# Patient Record
Sex: Female | Born: 2000 | Race: White | Hispanic: Yes | Marital: Single | Smoking: Former smoker
Health system: Southern US, Community
[De-identification: ages and names within clinical notes are randomized; demographics above are authoritative.]

## PROBLEM LIST (undated history)

## (undated) ENCOUNTER — Inpatient Hospital Stay (HOSPITAL_COMMUNITY): Payer: Self-pay

## (undated) DIAGNOSIS — D649 Anemia, unspecified: Secondary | ICD-10-CM

## (undated) DIAGNOSIS — Z789 Other specified health status: Secondary | ICD-10-CM

## (undated) DIAGNOSIS — H6690 Otitis media, unspecified, unspecified ear: Secondary | ICD-10-CM

## (undated) HISTORY — DX: Otitis media, unspecified, unspecified ear: H66.90

## (undated) HISTORY — PX: ADENOIDECTOMY: SUR15

## (undated) HISTORY — PX: APPENDECTOMY: SHX54

## (undated) HISTORY — PX: TYMPANOSTOMY TUBE PLACEMENT: SHX32

---
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
## 2019-05-22 ENCOUNTER — Other Ambulatory Visit: Payer: Self-pay

## 2019-05-22 ENCOUNTER — Telehealth: Payer: Self-pay | Admitting: Pediatrics

## 2019-05-22 ENCOUNTER — Encounter: Payer: Self-pay | Admitting: Pediatrics

## 2019-05-22 ENCOUNTER — Ambulatory Visit (INDEPENDENT_AMBULATORY_CARE_PROVIDER_SITE_OTHER): Payer: Medicaid Other | Admitting: Pediatrics

## 2019-05-22 DIAGNOSIS — Z20828 Contact with and (suspected) exposure to other viral communicable diseases: Secondary | ICD-10-CM | POA: Diagnosis not present

## 2019-05-22 DIAGNOSIS — Z20822 Contact with and (suspected) exposure to covid-19: Secondary | ICD-10-CM

## 2019-05-22 NOTE — Progress Notes (Signed)
Virtual Visit via Video Note  I connected with Donna Fernandez 's patient  on 05/22/19 at  3:45 PM EDT by a video enabled telemedicine application and verified that I am speaking with the correct person using two identifiers.   Location of patient/parent: Home   I discussed the limitations of evaluation and management by telemedicine and the availability of in person appointments.  I discussed that the purpose of this telehealth visit is to provide medical care while limiting exposure to the novel coronavirus.  The patient expressed understanding and agreed to proceed.  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  She is unsure if she was in direct contact but has been in the vicinity of the cases and been exposed to other coworkers.  She has now started with nausea and cough symptoms since yesterday.  She also is having some nasal congestion.  No history of any vomiting or diarrhea.  She had some abdominal discomfort and some clear vaginal discharge yesterday.  She took a pregnancy test at home that was negative.  She has an IUD in place for contraception.  No history of any fevers.  She reported that her siblings also had some cough and nasal congestion but no fevers.  Her coworkers got tested at work and she would like to get tested too.   Observations/Objective:  Well-appearing with no respiratory distress.  No coughing during the visit.  Assessment and Plan:  18 year old female with mild upper respiratory illness. Possible exposure to COVID at work.  Placed an order for COVID testing at the [place] center.  Advised patient to isolate at home for 10 days since the start of symptoms and at least 24 hours fever free before returning to work.  She will be notified of the test results within 4 to 7 days after  testing. Continue to wear mask, wash hands and social distance.  Follow Up Instructions:    I discussed the assessment and treatment plan with the patient and/or parent/guardian. They were provided an opportunity to ask questions and all were answered. They agreed with the plan and demonstrated an understanding of the instructions.   They were advised to call back or seek an in-person evaluation in the emergency room if the symptoms worsen or if the condition fails to improve as anticipated.  I spent 15 minutes on this telehealth visit inclusive of face-to-face video and care coordination time I was located at [place] [facility] during this encounter.  [name], MD

## 2019-05-22 NOTE — Telephone Encounter (Signed)
Spoke with Dr Derrell Lolling to see what needs to be included in letter regarding quarantine time.

## 2019-05-22 NOTE — Telephone Encounter (Signed)
Patient call providing the work fax number [phone] for the excuse for work.

## 2019-05-22 NOTE — Telephone Encounter (Signed)
Letter drafted and faxed to her work.

## 2019-05-23 ENCOUNTER — Other Ambulatory Visit: Payer: Self-pay

## 2019-05-23 DIAGNOSIS — Z20822 Contact with and (suspected) exposure to covid-19: Secondary | ICD-10-CM

## 2019-05-26 LAB — NOVEL CORONAVIRUS, NAA: SARS-CoV-2, NAA: NOT DETECTED

## 2019-07-10 ENCOUNTER — Ambulatory Visit: Payer: Medicaid Other | Admitting: Family

## 2019-12-30 ENCOUNTER — Other Ambulatory Visit: Payer: Self-pay

## 2019-12-30 ENCOUNTER — Emergency Department (HOSPITAL_COMMUNITY)
Admission: EM | Admit: 2019-12-30 | Discharge: 2019-12-30 | Disposition: A | Discharge status: Home / Self Care | Payer: Medicaid Other | Attending: Emergency Medicine | Admitting: Emergency Medicine

## 2019-12-30 ENCOUNTER — Encounter (HOSPITAL_COMMUNITY): Payer: Self-pay | Admitting: Emergency Medicine

## 2019-12-30 DIAGNOSIS — M79605 Pain in left leg: Secondary | ICD-10-CM | POA: Diagnosis present

## 2019-12-30 DIAGNOSIS — L03116 Cellulitis of left lower limb: Secondary | ICD-10-CM | POA: Insufficient documentation

## 2019-12-30 LAB — CBC WITH DIFFERENTIAL/PLATELET
Abs Immature Granulocytes: 0.04 10*3/uL (ref 0.00–0.07)
Basophils Absolute: 0 10*3/uL (ref 0.0–0.1)
Basophils Relative: 0 %
Eosinophils Absolute: 0.2 10*3/uL (ref 0.0–0.5)
Eosinophils Relative: 3 %
HCT: 43.3 % (ref 36.0–46.0)
Hemoglobin: 14 g/dL (ref 12.0–15.0)
Immature Granulocytes: 0 %
Lymphocytes Relative: 38 %
Lymphs Abs: 3.4 10*3/uL (ref 0.7–4.0)
MCH: 30.4 pg (ref 26.0–34.0)
MCHC: 32.3 g/dL (ref 30.0–36.0)
MCV: 93.9 fL (ref 80.0–100.0)
Monocytes Absolute: 0.7 10*3/uL (ref 0.1–1.0)
Monocytes Relative: 8 %
Neutro Abs: 4.6 10*3/uL (ref 1.7–7.7)
Neutrophils Relative %: 51 %
Platelets: 337 10*3/uL (ref 150–400)
RBC: 4.61 MIL/uL (ref 3.87–5.11)
RDW: 12.5 % (ref 11.5–15.5)
WBC: 9.1 10*3/uL (ref 4.0–10.5)
nRBC: 0 % (ref 0.0–0.2)

## 2019-12-30 LAB — BASIC METABOLIC PANEL
Anion gap: 9 (ref 5–15)
BUN: 12 mg/dL (ref 6–20)
CO2: 25 mmol/L (ref 22–32)
Calcium: 9.1 mg/dL (ref 8.9–10.3)
Chloride: 105 mmol/L (ref 98–111)
Creatinine, Ser: 0.55 mg/dL (ref 0.44–1.00)
GFR calc Af Amer: >60 mL/min (ref >60)
GFR calc non Af Amer: >60 mL/min (ref >60)
Glucose, Bld: 99 mg/dL (ref 70–99)
Potassium: 3.5 mmol/L (ref 3.5–5.1)
Sodium: 139 mmol/L (ref 135–145)

## 2019-12-30 LAB — I-STAT BETA HCG BLOOD, ED (MC, WL, AP ONLY): I-stat hCG, quantitative: <5 m[IU]/mL (ref <5)

## 2019-12-30 MED ORDER — DOXYCYCLINE HYCLATE 100 MG PO TABS
100.0000 mg | ORAL_TABLET | Freq: Once | ORAL | Status: AC
  Administered 2019-12-30: 100 mg via ORAL
  Filled 2019-12-30: qty 1

## 2019-12-30 MED ORDER — DOXYCYCLINE HYCLATE 100 MG PO CAPS: 100.0000 mg | ORAL_CAPSULE | Freq: Two times a day (BID) | ORAL | 0 refills | Status: DC

## 2019-12-30 NOTE — Discharge Instructions (Addendum)
Do not breast feed while taking the antibiotic.    If you wish to continue breast feeding, you should pump and waste.    Otherwise, do not breast feed until 2 days after stopping the antibiotic.  Return to the ER if symptoms worsen.

## 2019-12-30 NOTE — ED Triage Notes (Addendum)
Patient reports worsening cellulitis at left foot onset yesterday with redness,swelling and pain , denies injury , no fever/ambulatory .

## 2019-12-30 NOTE — ED Provider Notes (Signed)
[facility] EMERGENCY DEPARTMENT Provider Note   CSN: [id number] Arrival date & time: 12/30/19  7829     History Chief Complaint  Patient presents with  . Cellulitis    L foot    Donna Fernandez is a 19 y.o. female.  Patient presents to the emergency department with a chief complaint of left leg pain.  She states that she has noticed some redness on her left foot.  She states that she may have gotten a sore from wearing boots earlier in the week.  She denies any fevers or chills.  Denies history of diabetes.  She denies any treatments prior to arrival.  Has had some red streaking up her foot.  The history is provided by the patient. No language interpreter was used.       Past Medical History:  Diagnosis Date  . Anemia   . Medical history non-contributory   . Otitis media, recurrent     Patient Active Problem List   Diagnosis Date Noted  . 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 Normal labor 04/05/2018  . Preterm contractions 03/07/2018  . Myopia 09/02/2014  . BMI (body mass index), pediatric, 85% to less than 95% for age 38/30/2015    Past Surgical History:  Procedure Laterality Date  . ADENOIDECTOMY    . APPENDECTOMY    . LAPAROSCOPIC APPENDECTOMY N/A 07/29/2013   Procedure: APPENDECTOMY LAPAROSCOPIC;  Surgeon: Judie Petit. Leonia Corona, MD;  Location: [facility] OR;  Service: Pediatrics;  Laterality: N/A;  . TYMPANOSTOMY TUBE PLACEMENT       OB History    Gravida  1   Para  1   Term  1   Preterm      AB      Living  1     SAB      TAB      Ectopic      Multiple  0   Live Births  1           Family History  Problem Relation Age of Onset  . Asthma Mother   . Diabetes type II Maternal Uncle   . Diabetes type II Maternal Aunt     Social History   Tobacco Use  . Smoking status: Never Smoker  . Smokeless tobacco: Never Used  Substance Use Topics  . Alcohol use: No    Alcohol/week: 0.0 standard drinks  . Drug use: No     Home Medications Prior to Admission medications   Medication Sig Start Date End Date Taking? Authorizing Provider  ibuprofen (ADVIL,MOTRIN) 600 MG tablet Take 1 tablet (600 mg total) by mouth every 6 (six) hours. Patient not taking: Reported on 10/25/2018 04/07/18   Katrinka Blazing, [place], CNM  naproxen (NAPROSYN) 250 MG tablet Take 1 tablet (250 mg total) by mouth 2 (two) times daily with a meal. 11/14/18   Simha, Bartolo Darter, MD  Prenatal Vit-Fe Fumarate-FA (PRENATAL MULTIVITAMIN) TABS tablet Take 1 tablet by mouth daily at 12 noon.    [provider]    Allergies    Patient has no known allergies.  Review of Systems   Review of Systems  All other systems reviewed and are negative.   Physical Exam Updated Vital Signs BP 125/87   Pulse 88   Temp 98.4 F (36.9 C)   Resp 17   Ht 5\' 1"  (1.549 m)   Wt 85 kg   SpO2 100%   BMI 35.41 kg/m   Physical Exam Vitals  and nursing note reviewed.  Constitutional:      General: She is not in acute distress.    Appearance: She is well-developed.  HENT:     Head: Normocephalic and atraumatic.  Eyes:     Conjunctiva/sclera: Conjunctivae normal.  Cardiovascular:     Rate and Rhythm: Normal rate.     Heart sounds: No murmur.  Pulmonary:     Effort: Pulmonary effort is normal. No respiratory distress.  Abdominal:     General: There is no distension.  Musculoskeletal:     Cervical back: Neck supple.       Feet:     Comments: Moves all extremities  Skin:    General: Skin is warm and dry.  Neurological:     Mental Status: She is alert and oriented to person, place, and time.  Psychiatric:        Mood and Affect: Mood normal.        Behavior: Behavior normal.     ED Results / Procedures / Treatments   Labs (all labs ordered are listed, but only abnormal results are displayed) Labs Reviewed  CBC WITH DIFFERENTIAL/PLATELET  BASIC METABOLIC PANEL  I-STAT BETA HCG BLOOD, ED ([facility], [facility], [facility] ONLY)    EKG None  Radiology No results  found.  Procedures Procedures (including critical care time)  Medications Ordered in ED Medications  doxycycline (VIBRA-TABS) tablet 100 mg (100 mg Oral Given 12/30/19 6761)    ED Course  I have reviewed the triage vital signs and the nursing notes.  Pertinent labs & imaging results that were available during my care of the patient were reviewed by me and considered in my medical decision making (see chart for details).    MDM Rules/Calculators/A&P                      Patient with cellulitis of her left lower extremity.  There is some streaking.  She has no evidence of abscess.  She is not diabetic.  She is not pregnant.  She is breast-feeding, but I have encouraged her to pump and waste if she wishes to continue breast-feeding.  Her 20 mo infant is eating table food and is not dependant on the breast milk.  Return precautions given.   Final Clinical Impression(s) / ED Diagnoses Final diagnoses:  Cellulitis of left lower extremity    Rx / DC Orders ED Discharge Orders         Ordered    doxycycline (VIBRAMYCIN) 100 MG capsule  2 times daily     12/30/19 0615           Montine Circle, PA-C 12/30/19 0617    Palumbo, April, MD 12/30/19 9509

## 2020-01-06 ENCOUNTER — Telehealth: Payer: Self-pay | Admitting: Pediatrics

## 2020-01-06 NOTE — Telephone Encounter (Signed)

## 2020-01-07 ENCOUNTER — Encounter: Payer: Self-pay | Admitting: Pediatrics

## 2020-01-07 ENCOUNTER — Other Ambulatory Visit: Payer: Self-pay

## 2020-01-07 ENCOUNTER — Ambulatory Visit (INDEPENDENT_AMBULATORY_CARE_PROVIDER_SITE_OTHER): Payer: Medicaid Other | Admitting: Pediatrics

## 2020-01-07 VITALS — BP 118/76 | HR 88 | Ht 61.58 in | Wt 188.0 lb

## 2020-01-07 DIAGNOSIS — Z113 Encounter for screening for infections with a predominantly sexual mode of transmission: Secondary | ICD-10-CM

## 2020-01-07 DIAGNOSIS — Z0000 Encounter for general adult medical examination without abnormal findings: Secondary | ICD-10-CM | POA: Diagnosis not present

## 2020-01-07 DIAGNOSIS — Z68.41 Body mass index (BMI) pediatric, greater than or equal to 95th percentile for age: Secondary | ICD-10-CM

## 2020-01-07 DIAGNOSIS — E669 Obesity, unspecified: Secondary | ICD-10-CM | POA: Diagnosis not present

## 2020-01-07 DIAGNOSIS — Z0001 Encounter for general adult medical examination with abnormal findings: Secondary | ICD-10-CM

## 2020-01-07 LAB — POCT RAPID HIV: Rapid HIV, POC: NEGATIVE

## 2020-01-07 NOTE — Patient Instructions (Signed)
Adult Primary Care Clinics Name Criteria Services   [facility]  Address: [address] [place], [place] [address]  Phone: [phone] Hours: Monday - Friday 9 AM -6 PM  Types of insurance accepted:  Commercial insurance [facility] (orange card) Medicaid Medicare Uninsured  Language services:  Video and phone interpreters available   Ages 18 and older    Adult primary care Onsite pharmacy Integrated behavioral health Financial assistance counseling Walk-in hours for established patients  Financial assistance counseling hours: Tuesdays 2:00PM - 5:00PM  Thursday 8:30AM - 4:30PM  Space is limited, 10 on Tuesday and 20 on Thursday. It's on first come first serve basis  Name Criteria Services   [facility]  Address: [address] [place], [place] [address]  Phone: [phone]  Hours: Monday - Friday 8:30 AM - 5 PM  Types of insurance accepted:  Commercial insurance Medicaid Medicare Uninsured  Language services:  Video and phone interpreters available   All ages - newborn to adult   Primary care for all ages (children and adults) Integrated behavioral health Nutritionist Financial assistance counseling   Name Criteria Services   [facility]  Located on the ground floor of [facility]  Address: [address]  [place],  [place]  [address]  Phone: [phone]  Hours: Monday - Friday 8:15 AM - 5 PM  Types of insurance accepted:  Commercial insurance Medicaid Medicare Uninsured  Language services:  Video and phone interpreters available   Ages 18 and older   Adult primary care Nutritionist Certified Diabetes Educator  Integrated behavioral health Financial assistance counseling   Name Criteria Services   [facility]  Address: [address] [place], [place] [address]  Phone:  [phone]  Hours: Monday - Friday 8:30 AM - 5 PM    Types of insurance accepted:  Commercial insurance Medicaid Medicare Uninsured  Language services:  Video and phone interpreters available   All ages - newborn to adult   Primary care for all ages (children and adults) Integrated behavioral health Financial assistance counseling    

## 2020-01-07 NOTE — Progress Notes (Signed)
Adolescent Well Care Visit Donna Fernandez is a 19 y.o. female who is here for well care.    PCP:  [name], MD   History was provided by the patient.   Current Issues: Current concerns include: No specific concerns today.  Overall patient is doing well.  She has a history of migraines but reports that both have been better recently and she only occasionally uses medication such as Motrin for headaches.  Patient has significant sleep deprivation due to working third shift and going to school during the daytime.  She is a Consulting civil engineer at [employer]  She also has to care for her 51-month-old baby.  Nutrition: Nutrition/Eating Behaviors: Eats a variety of foods  Adequate calcium in diet?:  Cheese, some milk Supplements/ Vitamins: No  Exercise/ Media: Play any Sports?/ Exercise: Goes to the gym to exercise   Sleep:  Sleep: Gets only 4 to 5 hours of sleep per day due to night shift and has school during the daytime  Social Screening: Lives with: Mom and siblings.  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  no Stressors of note: yes- school & work  Education: School Name: Engineer, manufacturing nursing degree  Menstruation:   No LMP recorded. (Menstrual status: IUD). Menstrual History: IUD placed after birth of baby 04/18/2018  Confidential Social History: Tobacco?  no Secondhand smoke exposure?  no Drugs/ETOH?  no  Sexually Active?  yes but not currently Pregnancy Prevention: IUD  Safe at home, in school & in relationships?  Yes Safe to self?  Yes   Screenings: Patient has a dental home: yes  The patient completed the Rapid Assessment for Adolescent Preventive Services screening questionnaire and the following topics were identified as risk factors and discussed: healthy eating, exercise, tobacco use, drug use, condom use and  mental health issues   PHQ-9 completed and results indicated-negative screen  Physical Exam:  Vitals:   01/07/20 1344  BP: 118/76  Pulse: 88  Weight: 188 lb (85.3 kg)  Height: 5' 1.58" (1.564 m)   BP 118/76 (BP Location: Right Arm, Patient Position: Sitting, Cuff Size: Large)   Pulse 88   Ht 5' 1.58" (1.564 m)   Wt 188 lb (85.3 kg)   BMI 34.86 kg/m  Body mass index: body mass index is 34.86 kg/m. Blood pressure percentiles are not available for patients who are 18 years or older.   Hearing Screening   Method: Audiometry   125Hz  250Hz  500Hz  1000Hz  2000Hz  3000Hz  4000Hz  6000Hz  8000Hz   Right ear:   20 20 20  20     Left ear:   20 20 20  20       Visual Acuity Screening   Right eye Left eye Both eyes  Without correction: 20/20 20/20 20/20   With correction:       General Appearance:   alert, oriented, no acute distress  HENT: Normocephalic, PE tube left ear no obvious abnormality, conjunctiva clear  Mouth:   Normal appearing teeth, no obvious discoloration, dental caries, or dental caps  Neck:   Supple; thyroid: no enlargement, symmetric, no tenderness/mass/nodules  Chest  normal breast exam, no masses palpated  Lungs:   Clear to auscultation bilaterally, normal work of breathing  Heart:   Regular rate and rhythm, S1 and S2 normal, no murmurs;   Abdomen:   Soft, non-tender, no mass, or organomegaly  GU normal female  external genitalia, pelvic not performed  Musculoskeletal:   Tone and strength strong and symmetrical, all extremities               Lymphatic:   No cervical adenopathy  Skin/Hair/Nails:   Skin warm, dry and intact, no rashes, no bruises or petechiae  Neurologic:   Strength, gait, and coordination normal and age-appropriate     Assessment and Plan:    19 year old percent for well visit Obesity Counseled regarding 5-2-1-0 goals of healthy active living including:  - eating at least 5 fruits and vegetables a day - at least 1 hour of activity - no sugary  beverages - eating three meals each day with age-appropriate servings - age-appropriate screen time - age-appropriate sleep patterns  Also encouraged patient to continue daily multivitamin.  She is trying to wean her baby from breast-feeding is reminded her to take her daily vitamin D. Also discussed transition to family medicine patient will be turning 19 this year and also needs OB/GYN follow-up.    Hearing screening result:normal-had PE tubes placed left ear 04/19/2019 Vision screening result: normal      Return in 1 year (on 01/06/2021).[name], MD

## 2020-05-20 ENCOUNTER — Other Ambulatory Visit (HOSPITAL_COMMUNITY)
Admission: RE | Admit: 2020-05-20 | Discharge: 2020-05-20 | Disposition: A | Discharge status: Home / Self Care | Payer: Medicaid Other | Source: Ambulatory Visit | Attending: Family | Admitting: Family

## 2020-05-20 ENCOUNTER — Ambulatory Visit (INDEPENDENT_AMBULATORY_CARE_PROVIDER_SITE_OTHER): Payer: Medicaid Other | Admitting: Family

## 2020-05-20 VITALS — BP 108/73 | HR 76 | Wt 193.6 lb

## 2020-05-20 DIAGNOSIS — Z113 Encounter for screening for infections with a predominantly sexual mode of transmission: Secondary | ICD-10-CM | POA: Diagnosis not present

## 2020-05-20 DIAGNOSIS — Z30432 Encounter for removal of intrauterine contraceptive device: Secondary | ICD-10-CM | POA: Diagnosis not present

## 2020-05-20 DIAGNOSIS — N946 Dysmenorrhea, unspecified: Secondary | ICD-10-CM

## 2020-05-20 DIAGNOSIS — Z30011 Encounter for initial prescription of contraceptive pills: Secondary | ICD-10-CM | POA: Diagnosis not present

## 2020-05-20 MED ORDER — NORETHINDRONE ACET-ETHINYL EST 1.5-30 MG-MCG PO TABS: 1.0000 | ORAL_TABLET | Freq: Every day | ORAL | 3 refills | Status: DC

## 2020-05-20 NOTE — Patient Instructions (Signed)
Today we removed your IUD.  I sent birth control pills to your pharmacy.  Reach out if you have any questions or concerns before your next appointment.

## 2020-05-20 NOTE — Progress Notes (Signed)
History was provided by the patient.  Donna Fernandez is a 19 y.o. female who is here for IUD removal, desires OCPs.   PCP confirmed? Yes.    Donna File, MD  HPI:   -had postpartum IUD 03/2018 -desires removal and wants OCPs -feels her mood is different with IUD  -also recent cramping, can feel IUD -no period with IUD, some recent spotting  -no pain with intercourse, no pelvic or abdominal pain   Review of Systems  Constitutional: Negative for chills, fever and malaise/fatigue.  Eyes: Negative for blurred vision and pain.  Cardiovascular: Negative for chest pain and palpitations.  Gastrointestinal: Negative for abdominal pain and nausea.  Genitourinary: Negative for dysuria and frequency.  Musculoskeletal: Negative for joint pain and myalgias.  Neurological: Negative for dizziness and headaches.  Psychiatric/Behavioral: The patient is not nervous/anxious.     Patient Active Problem List   Diagnosis Date Noted  . 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 Normal labor 04/05/2018  . Preterm contractions 03/07/2018  . Myopia 09/02/2014  . BMI (body mass index), pediatric, 85% to less than 95% for age 28/30/2015    Current Outpatient Medications on Fernandez Prior to Visit  Medication Sig Dispense Refill  . doxycycline (VIBRAMYCIN) 100 MG capsule Take 1 capsule (100 mg total) by mouth 2 (two) times daily. (Patient not taking: Reported on 05/20/2020) 20 capsule 0  . ibuprofen (ADVIL,MOTRIN) 600 MG tablet Take 1 tablet (600 mg total) by mouth every 6 (six) hours. (Patient not taking: Reported on 10/25/2018) 30 tablet 0  . naproxen (NAPROSYN) 250 MG tablet Take 1 tablet (250 mg total) by mouth 2 (two) times daily with a meal. (Patient not taking: Reported on 01/07/2020) 30 tablet 2  . Prenatal Vit-Fe Fumarate-FA (PRENATAL MULTIVITAMIN) TABS tablet Take 1 tablet by mouth daily at 12 noon. (Patient not taking: Reported on 05/20/2020)     No current facility-administered medications  on Fernandez prior to visit.    No Known Allergies  Physical Exam:    Vitals:   05/20/20 0941  BP: 108/73  Pulse: 76  Weight: 193 lb 9.6 oz (87.8 kg)   Wt Readings from Last 3 Encounters:  05/20/20 193 lb 9.6 oz (87.8 kg) (97 %, Z= 1.84)*  01/07/20 188 lb (85.3 kg) (96 %, Z= 1.75)*  12/30/19 187 lb 6.3 oz (85 kg) (96 %, Z= 1.74)*   * Growth percentiles are based on CDC (Girls, 2-20 Years) data.     Blood pressure percentiles are not available for patients who are 18 years or older. No LMP recorded. (Menstrual status: IUD).  Physical Exam Vitals reviewed. Exam conducted with a chaperone present.  Constitutional:      General: She is not in acute distress.    Appearance: Normal appearance. She is not toxic-appearing.  HENT:     Head: Normocephalic.     Mouth/Throat:     Mouth: Mucous membranes are moist.  Eyes:     General: No scleral icterus.    Extraocular Movements: Extraocular movements intact.     Pupils: Pupils are equal, round, and reactive to light.  Cardiovascular:     Rate and Rhythm: Normal rate.  Pulmonary:     Effort: Pulmonary effort is normal.  Abdominal:     General: There is no distension.  Genitourinary:    Vagina: Normal. No vaginal discharge or erythema.     Cervix: Normal.     Comments: 2 sttrings of expected length visualized at os prior  to removal  Musculoskeletal:     Cervical back: Normal range of motion.  Lymphadenopathy:     Cervical: No cervical adenopathy.  Neurological:     Mental Status: She is alert.      Assessment/Plan: 1. Dysmenorrhea 2. Encounter for IUD removal 3. OCP (oral contraceptive pills) initiation 4. Routine screening for STI (sexually transmitted infection) - Urine cytology ancillary only  19 yo A/I G1P1 presents for IUD removal and restart of OCPs; no contraindication for estrogen use; will restart same COC as before. GC/C today per protocol.  8-week video follow up or sooner if needed.

## 2020-05-24 LAB — URINE CYTOLOGY ANCILLARY ONLY
Bacterial Vaginitis-Urine: NEGATIVE
Candida Urine: NEGATIVE
Chlamydia: NEGATIVE
Comment: NEGATIVE
Comment: NEGATIVE
Comment: NORMAL
Neisseria Gonorrhea: NEGATIVE
Trichomonas: NEGATIVE

## 2020-05-29 ENCOUNTER — Encounter: Payer: Self-pay | Admitting: Family

## 2020-07-14 ENCOUNTER — Telehealth: Payer: Medicaid Other | Admitting: Family

## 2020-07-14 ENCOUNTER — Encounter: Payer: Self-pay | Admitting: Family

## 2020-11-17 LAB — OB RESULTS CONSOLE GBS: GBS: POSITIVE

## 2020-12-30 ENCOUNTER — Emergency Department (HOSPITAL_COMMUNITY): Payer: Medicaid Other

## 2020-12-30 ENCOUNTER — Other Ambulatory Visit: Payer: Self-pay

## 2020-12-30 ENCOUNTER — Emergency Department (HOSPITAL_COMMUNITY)
Admission: EM | Admit: 2020-12-30 | Discharge: 2020-12-30 | Disposition: A | Discharge status: Home / Self Care | Payer: Medicaid Other | Attending: Emergency Medicine | Admitting: Emergency Medicine

## 2020-12-30 DIAGNOSIS — S93491A Sprain of other ligament of right ankle, initial encounter: Secondary | ICD-10-CM

## 2020-12-30 DIAGNOSIS — S99911A Unspecified injury of right ankle, initial encounter: Secondary | ICD-10-CM | POA: Diagnosis present

## 2020-12-30 DIAGNOSIS — S93431A Sprain of tibiofibular ligament of right ankle, initial encounter: Secondary | ICD-10-CM | POA: Diagnosis not present

## 2020-12-30 DIAGNOSIS — W108XXA Fall (on) (from) other stairs and steps, initial encounter: Secondary | ICD-10-CM | POA: Insufficient documentation

## 2020-12-30 DIAGNOSIS — Y990 Civilian activity done for income or pay: Secondary | ICD-10-CM | POA: Insufficient documentation

## 2020-12-30 DIAGNOSIS — Y9301 Activity, walking, marching and hiking: Secondary | ICD-10-CM | POA: Diagnosis not present

## 2020-12-30 MED ORDER — CYCLOBENZAPRINE HCL 10 MG PO TABS: 10.0000 mg | ORAL_TABLET | Freq: Two times a day (BID) | ORAL | 0 refills | Status: DC | PRN

## 2020-12-30 MED ORDER — IBUPROFEN 600 MG PO TABS: 600.0000 mg | ORAL_TABLET | Freq: Four times a day (QID) | ORAL | 0 refills | Status: DC | PRN

## 2020-12-30 NOTE — ED Provider Notes (Signed)
[facility] EMERGENCY DEPARTMENT Provider Note   CSN: [id number] Arrival date & time: 12/30/20  1240     History No chief complaint on file.   Donna Fernandez is a 20 y.o. female.  The history is provided by the patient. No language interpreter was used.     20 year old female presenting for evaluation of a recent fall.  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  She believes she twisted her ankle.  Report pain about the ankle, sharp, shooting, radiates with her leg worse with ambulation.  She denies hitting her head or loss of consciousness.  She rates the pain as 4 out of 10.  She has noticed swelling but states swelling seems to be improving.  No numbness.  No precipitating symptoms prior to the fall.  She is not currently pregnant.  No specific treatment tried.   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 Leonia Corona, MD;  Location: [facility] OR;  Service: Pediatrics;  Laterality: N/A;   TYMPANOSTOMY TUBE PLACEMENT       OB History    Gravida  1   Para  1   Term  1   Preterm      AB      Living  1     SAB      IAB      Ectopic      Multiple  0   Live Births  1           Family History  Problem Relation Age of Onset   Asthma Mother    Diabetes type II Maternal Uncle    Diabetes type II Maternal Aunt      Social History   Tobacco Use   Smoking status: Never Smoker   Smokeless tobacco: Never Used  Substance Use Topics   Alcohol use: No    Alcohol/week: 0.0 standard drinks   Drug use: No    Home Medications Prior to Admission medications   Medication Sig Start Date End Date Taking? Authorizing Provider  doxycycline (VIBRAMYCIN) 100 MG capsule Take 1 capsule (100 mg total) by mouth 2 (two) times daily. Patient not taking: Reported on 05/20/2020 12/30/19   Roxy Horseman, PA-C  ibuprofen (ADVIL,MOTRIN) 600 MG tablet Take 1 tablet (600 mg total) by mouth every 6 (six) hours. Patient not taking: Reported on 10/25/2018 04/07/18   Katrinka Blazing, [place], CNM  naproxen (NAPROSYN) 250 MG tablet Take 1 tablet (250 mg total) by mouth 2 (two) times daily with a meal. Patient not taking: Reported on 01/07/2020 11/14/18   [name], MD  Norethindrone Acetate-Ethinyl Estradiol (JUNEL 1.5/30) 1.5-30 MG-MCG tablet Take 1 tablet by mouth daily. 05/20/20   Georges Mouse, NP  Prenatal Vit-Fe Fumarate-FA (PRENATAL MULTIVITAMIN) TABS tablet Take 1 tablet by mouth daily at 12 noon.  Patient not taking: Reported on 05/20/2020    [provider]    Allergies    Patient has no known allergies.  Review of Systems   Review of Systems  Constitutional: Negative for fever.  Musculoskeletal: Positive for arthralgias.  Skin: Negative for wound.  Neurological: Negative for numbness.    Physical Exam Updated Vital Signs BP 133/86 (BP Location: Right Arm)    Pulse 93    Temp 98.3 F (36.8 C) (Oral)    Resp 16    SpO2 98%   Physical Exam Vitals and nursing note reviewed.  Constitutional:      General: She is not in acute distress.    Appearance: She is well-developed and well-nourished.  HENT:     Head: Atraumatic.  Eyes:     Conjunctiva/sclera: Conjunctivae normal.  Musculoskeletal:        General: Tenderness (Right ankle: Edema noted to lateral malleoli region with tenderness to  palpation.  Normal dorsiflexion and plantarflexion.  Pain with inversion and eversion.  Pedal pulse palpable with brisk cap refill.  No tenderness to fifth metatarsal region.) present.     Cervical back: Neck supple.     Comments: Right knee and right hip nontender  Skin:    Findings: No rash.  Neurological:     Mental Status: She is alert.  Psychiatric:        Mood and Affect: Mood and affect normal.     ED Results / Procedures / Treatments   Labs (all labs ordered are listed, but only abnormal results are displayed) Labs Reviewed - No data to display  EKG None  Radiology DG Ankle Complete Right  Result Date: 12/30/2020 CLINICAL DATA:  Pt fell down some stairs Pain laterally Some swelling EXAM: RIGHT ANKLE - COMPLETE 3+ VIEW COMPARISON:  None. FINDINGS: There is no evidence of fracture, dislocation, or joint effusion. There is no evidence of arthropathy or other focal bone abnormality. Soft tissues are unremarkable. IMPRESSION: Negative. Electronically Signed   By: Emmaline Kluver M.D.   On: 12/30/2020 13:50    Procedures Procedures   Medications Ordered in ED Medications - No data to display  ED Course  I have reviewed the triage vital signs and the nursing notes.  Pertinent labs & imaging results that were available during my care of the patient were reviewed by me and considered in my medical decision making (see chart for details).    MDM Rules/Calculators/A&P                          BP 133/86 (BP Location: Right Arm)    Pulse 93    Temp 98.3 F (36.8 C) (Oral)    Resp 16    SpO2 98%   Final Clinical Impression(s) / ED Diagnoses Final diagnoses:  Sprain of anterior talofibular ligament of right ankle, initial encounter    Rx / DC Orders ED Discharge Orders         Ordered    ibuprofen (ADVIL) 600 MG tablet  Every 6 hours PRN        12/30/20 1742    cyclobenzaprine (FLEXERIL) 10 MG tablet  2 times daily PRN        12/30/20 1742         5:40  PM Patient twisted her right ankle when she slipped on the steps.  X-ray of right ankle negative, will treat for ankle sprain with rice therapy, and ankle brace.  Orthopedic  referral given as needed.   Fayrene Helper, PA-C 12/30/20 1743    Mancel Bale, MD 01/01/21 [phone]

## 2020-12-30 NOTE — Progress Notes (Signed)
Orthopedic Tech Progress Note Patient Details:  Donna Fernandez 2001/08/04 [id number]  Ortho Devices Type of Ortho Device: ASO Ortho Device/Splint Location: Right Lower Extremity Ortho Device/Splint Interventions: Ordered,Application,Adjustment   Post Interventions Patient Tolerated: Well Instructions Provided: Adjustment of device,Care of device,Poper ambulation with device   Gerald Stabs 12/30/2020, 7:40 PM

## 2020-12-30 NOTE — ED Notes (Signed)
Ortho tech called for ASO placement

## 2020-12-30 NOTE — ED Triage Notes (Signed)
Pt here with c/o right ankle pain after falling down a step this morning

## 2020-12-31 ENCOUNTER — Telehealth: Payer: Self-pay

## 2020-12-31 NOTE — Telephone Encounter (Signed)
Transition Care Management Follow-up Telephone Call  Date of discharge and from where: 12/30/2020 from [facility]  How have you been since you were released from the hospital? Pt states that she is feeling well and will call ortho to schedule.   Any questions or concerns? No  Items Reviewed:  Did the pt receive and understand the discharge instructions provided? Yes   Medications obtained and verified? Yes   Other? No   Any new allergies since your discharge? No   Dietary orders reviewed? n/a  Do you have support at home? Yes   Home Care and Equipment/Supplies: Were any new equipment or medical supplies ordered?  Yes: Ankle brace  Were you able to get the supplies/equipment? Hospital Dispensed.  Do you have any questions related to the use of the equipment or supplies? No  Functional Questionnaire: (I = Independent and D = Dependent) ADLs: I  Bathing/Dressing- I  Meal Prep- I  Eating- I  Maintaining continence- I  Transferring/Ambulation- I  Managing Meds- I   Follow up appointments reviewed:   PCP Hospital f/u appt confirmed? No  Pt to follow up with [facility] f/u appt confirmed? No  Pt to call ortho for a follow up appt.   Are transportation arrangements needed? No   If their condition worsens, is the pt aware to call PCP or go to the Emergency Dept.? Yes  Was the patient provided with contact information for the PCP's office or ED? Yes  Was to pt encouraged to call back with questions or concerns? Yes

## 2021-02-15 ENCOUNTER — Encounter (HOSPITAL_COMMUNITY): Payer: Self-pay | Admitting: Emergency Medicine

## 2021-02-15 ENCOUNTER — Other Ambulatory Visit: Payer: Self-pay

## 2021-02-15 ENCOUNTER — Ambulatory Visit (HOSPITAL_COMMUNITY)
Admission: EM | Admit: 2021-02-15 | Discharge: 2021-02-15 | Disposition: A | Discharge status: Home / Self Care | Payer: Medicaid Other | Attending: Student | Admitting: Student

## 2021-02-15 DIAGNOSIS — R112 Nausea with vomiting, unspecified: Secondary | ICD-10-CM | POA: Diagnosis not present

## 2021-02-15 DIAGNOSIS — Z20828 Contact with and (suspected) exposure to other viral communicable diseases: Secondary | ICD-10-CM

## 2021-02-15 DIAGNOSIS — Z1152 Encounter for screening for COVID-19: Secondary | ICD-10-CM

## 2021-02-15 DIAGNOSIS — J069 Acute upper respiratory infection, unspecified: Secondary | ICD-10-CM | POA: Diagnosis not present

## 2021-02-15 MED ORDER — BENZONATATE 100 MG PO CAPS: 100.0000 mg | ORAL_CAPSULE | Freq: Three times a day (TID) | ORAL | 0 refills | Status: DC

## 2021-02-15 MED ORDER — PROMETHAZINE-DM 6.25-15 MG/5ML PO SYRP: 5.0000 mL | ORAL_SOLUTION | Freq: Four times a day (QID) | ORAL | 0 refills | Status: DC | PRN

## 2021-02-15 MED ORDER — ONDANSETRON 8 MG PO TBDP: 8.0000 mg | ORAL_TABLET | Freq: Three times a day (TID) | ORAL | 0 refills | Status: DC | PRN

## 2021-02-15 MED ORDER — ALBUTEROL SULFATE HFA 108 (90 BASE) MCG/ACT IN AERS: 1.0000 | INHALATION_SPRAY | Freq: Four times a day (QID) | RESPIRATORY_TRACT | 0 refills | Status: DC | PRN

## 2021-02-15 NOTE — ED Triage Notes (Signed)
Vomiting started Sunday.  Tried eating chicken and asparagus last night, unable to hold this down.  Cough started Sunday.  Vomited once this morning.  Fever quoted as 101 per patient

## 2021-02-15 NOTE — Discharge Instructions (Addendum)
-  Take the Zofran (ondansetron) up to 3 times daily for nausea and vomiting. Dissolve one pill under your tongue or between your teeth and your cheek. -Tessalon (Benzonatate) as needed for cough. Take one pill up to 3x daily (every 8 hours) -Promethazine DM cough syrup for congestion/cough. This could make you drowsy, so take at night before bed. -Albuterol inhaler as needed for cough, shortness of breath, wheezing -For fevers/chills, body aches, headaches- Take Tylenol 1000 mg 3 times daily, and ibuprofen 800 mg 3 times daily with food.  You can take these together, or alternate every 3-4 hours. -Seek additional medical attention if your symptoms worsen or persist, like you can't keep fluids down despite treatment, you become short of breath, you develop chest pain, etc

## 2021-02-15 NOTE — ED Provider Notes (Signed)
[facility]    CSN: [id number] Arrival date & time: 02/15/21  0941      History   Chief Complaint Chief Complaint  Patient presents with  . Emesis    HPI Donna Fernandez is a 20 y.o. female presenting with nausea, vomiting, chills, fever as high as 101 at home, cough. Symptoms for 2 days following exposure to influenza. Medical history noncontributory. Has not taken any medications for her symptoms. Few episodes of vomiting with decreased appetite and nausea. States she thinks she is wheezing (though no wheezing noted on exam today). Denies d/c/abd pain. Just finished her period and states she could not be pregnant. Not actively breastfeeding.  HPI  Past Medical History:  Diagnosis Date  . Anemia   . Medical history non-contributory   . Otitis media, recurrent     Patient Active Problem List   Diagnosis Date Noted  . 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 Normal labor 04/05/2018  . Preterm contractions 03/07/2018  . Myopia 09/02/2014  . BMI (body mass index), pediatric, 85% to less than 95% for age 64/30/2015    Past Surgical History:  Procedure Laterality Date  . ADENOIDECTOMY    . APPENDECTOMY    . LAPAROSCOPIC APPENDECTOMY N/A 07/29/2013   Procedure: APPENDECTOMY LAPAROSCOPIC;  Surgeon: Judie PetitM. Leonia CoronaShuaib Farooqui, MD;  Location: [facility];  Service: Pediatrics;  Laterality: N/A;  . TYMPANOSTOMY TUBE PLACEMENT      OB History    Gravida  1   Para  1   Term  1   Preterm      AB      Living  1     SAB      IAB      Ectopic      Multiple  0   Live Births  1            Home Medications    Prior to Admission medications   Medication Sig Start Date End Date Taking? Authorizing Provider  albuterol (VENTOLIN HFA) 108 (90 Base) MCG/ACT inhaler Inhale 1-2 puffs into the lungs every 6 (six) hours as needed for wheezing or shortness of breath. 02/15/21  Yes Rhys MartiniGraham, Naome Brigandi E, PA-C  benzonatate (TESSALON) 100 MG capsule Take 1 capsule (100 mg  total) by mouth every 8 (eight) hours. 02/15/21  Yes Rhys MartiniGraham, Elise Gladden E, PA-C  ondansetron (ZOFRAN ODT) 8 MG disintegrating tablet Take 1 tablet (8 mg total) by mouth every 8 (eight) hours as needed for nausea or vomiting. 02/15/21  Yes Rhys MartiniGraham, Tenesia Escudero E, PA-C  promethazine-dextromethorphan (PROMETHAZINE-DM) 6.25-15 MG/5ML syrup Take 5 mLs by mouth 4 (four) times daily as needed for cough. 02/15/21  Yes Rhys MartiniGraham, Alcus Bradly E, PA-C  Norethindrone Acetate-Ethinyl Estradiol (JUNEL 1.5/30) 1.5-30 MG-MCG tablet Take 1 tablet by mouth daily. 05/20/20 02/15/21  Georges MouseJones, Christy M, NP    Family History Family History  Problem Relation Age of Onset  . Asthma Mother   . Diabetes type II Maternal Uncle   . Diabetes type II Maternal Aunt     Social History Social History   Tobacco Use  . Smoking status: Never Smoker  . Smokeless tobacco: Never Used  Vaping Use  . Vaping Use: Never used  Substance Use Topics  . Alcohol use: No    Alcohol/week: 0.0 standard drinks  . Drug use: No     Allergies   Patient has no known allergies.   Review of Systems Review of Systems  Constitutional: Positive for chills and  fever. Negative for appetite change.  HENT: Positive for congestion. Negative for ear pain, rhinorrhea, sinus pressure, sinus pain and sore throat.   Eyes: Negative for redness and visual disturbance.  Respiratory: Positive for cough. Negative for chest tightness, shortness of breath and wheezing.   Cardiovascular: Negative for chest pain and palpitations.  Gastrointestinal: Positive for nausea and vomiting. Negative for abdominal pain, constipation and diarrhea.  Genitourinary: Negative for dysuria, frequency and urgency.  Musculoskeletal: Positive for myalgias.  Neurological: Negative for dizziness, weakness and headaches.  Psychiatric/Behavioral: Negative for confusion.  All other systems reviewed and are negative.    Physical Exam Triage Vital Signs ED Triage Vitals  Enc Vitals Group     BP  02/15/21 1007 130/81     Pulse Rate 02/15/21 1007 97     Resp 02/15/21 1007 20     Temp 02/15/21 1007 100 F (37.8 C)     Temp Source 02/15/21 1007 Oral     SpO2 02/15/21 1007 97 %     Weight --      Height --      Head Circumference --      Peak Flow --      Pain Score 02/15/21 1004 9     Pain Loc --      Pain Edu? --      Excl. in GC? --    No data found.  Updated Vital Signs BP 130/81 (BP Location: Right Arm)   Pulse 97   Temp 100 F (37.8 C) (Oral)   Resp 20   LMP 02/05/2021   SpO2 97%   Visual Acuity Right Eye Distance:   Left Eye Distance:   Bilateral Distance:    Right Eye Near:   Left Eye Near:    Bilateral Near:     Physical Exam Vitals reviewed.  Constitutional:      General: She is not in acute distress.    Appearance: Normal appearance. She is not ill-appearing.  HENT:     Head: Normocephalic and atraumatic.     Right Ear: Hearing, tympanic membrane, ear canal and external ear normal. No swelling or tenderness. There is no impacted cerumen. No mastoid tenderness. Tympanic membrane is not perforated, erythematous, retracted or bulging.     Left Ear: Hearing, tympanic membrane, ear canal and external ear normal. No swelling or tenderness. There is no impacted cerumen. No mastoid tenderness. Tympanic membrane is not perforated, erythematous, retracted or bulging.     Nose:     Right Sinus: No maxillary sinus tenderness or frontal sinus tenderness.     Left Sinus: No maxillary sinus tenderness or frontal sinus tenderness.     Mouth/Throat:     Mouth: Mucous membranes are moist.     Pharynx: Uvula midline. No oropharyngeal exudate or posterior oropharyngeal erythema.     Tonsils: No tonsillar exudate.     Comments: Moist mucous membranes Eyes:     Extraocular Movements: Extraocular movements intact.     Pupils: Pupils are equal, round, and reactive to light.  Cardiovascular:     Rate and Rhythm: Normal rate and regular rhythm.     Heart sounds: Normal  heart sounds.  Pulmonary:     Effort: Pulmonary effort is normal.     Breath sounds: Normal breath sounds and air entry. No wheezing, rhonchi or rales.  Chest:     Chest wall: No tenderness.  Abdominal:     General: Abdomen is flat. Bowel sounds are normal. There is no  distension.     Palpations: Abdomen is soft. There is no mass.     Tenderness: There is generalized abdominal tenderness. There is no right CVA tenderness, left CVA tenderness, guarding or rebound. Negative signs include Murphy's sign, Rovsing's sign and McBurney's sign.  Lymphadenopathy:     Cervical: No cervical adenopathy.  Skin:    General: Skin is warm.     Capillary Refill: Capillary refill takes less than 2 seconds.     Comments: Good skin turgor  Neurological:     General: No focal deficit present.     Mental Status: She is alert and oriented to person, place, and time.  Psychiatric:        Attention and Perception: Attention and perception normal.        Mood and Affect: Mood and affect normal.        Behavior: Behavior normal. Behavior is cooperative.        Thought Content: Thought content normal.        Judgment: Judgment normal.      UC Treatments / Results  Labs (all labs ordered are listed, but only abnormal results are displayed) Labs Reviewed - No data to display  EKG   Radiology No results found.  Procedures Procedures (including critical care time)  Medications Ordered in UC Medications - No data to display  Initial Impression / Assessment and Plan / UC Course  I have reviewed the triage vital signs and the nursing notes.  Pertinent labs & imaging results that were available during my care of the patient were reviewed by me and considered in my medical decision making (see chart for details).     This patient is a 20 year old female presenting with suspected influenza following exposure to this at home.  Today she is afebrile nontachycardic, oxygenating well on room air, no  wheezes rhonchi or rales, appears well-hydrated.  Zofran, promethazine, tessalon, albuterol as below.  Rec good hydration, BRAT diet.  Covid PCR declined. Influenza testing deferred as we are out of POC influenza tests at this facility. ED return precautions discussed.  Final Clinical Impressions(s) / UC Diagnoses   Final diagnoses:  Viral URI with cough  Nausea and vomiting, intractability of vomiting not specified, unspecified vomiting type  Exposure to influenza     Discharge Instructions     -Take the Zofran (ondansetron) up to 3 times daily for nausea and vomiting. Dissolve one pill under your tongue or between your teeth and your cheek. -Tessalon (Benzonatate) as needed for cough. Take one pill up to 3x daily (every 8 hours) -Promethazine DM cough syrup for congestion/cough. This could make you drowsy, so take at night before bed. -Albuterol inhaler as needed for cough, shortness of breath, wheezing -For fevers/chills, body aches, headaches- Take Tylenol 1000 mg 3 times daily, and ibuprofen 800 mg 3 times daily with food.  You can take these together, or alternate every 3-4 hours. -Seek additional medical attention if your symptoms worsen or persist, like you can't keep fluids down despite treatment, you become short of breath, you develop chest pain, etc    ED Prescriptions    Medication Sig Dispense Auth. Provider   ondansetron (ZOFRAN ODT) 8 MG disintegrating tablet Take 1 tablet (8 mg total) by mouth every 8 (eight) hours as needed for nausea or vomiting. 20 tablet Rhys Martini, PA-C   benzonatate (TESSALON) 100 MG capsule Take 1 capsule (100 mg total) by mouth every 8 (eight) hours. 21 capsule Rhys Martini, PA-C  promethazine-dextromethorphan (PROMETHAZINE-DM) 6.25-15 MG/5ML syrup Take 5 mLs by mouth 4 (four) times daily as needed for cough. 118 mL Rhys Martini, PA-C   albuterol (VENTOLIN HFA) 108 (90 Base) MCG/ACT inhaler Inhale 1-2 puffs into the lungs every 6  (six) hours as needed for wheezing or shortness of breath. 1 each Rhys Martini, PA-C     PDMP not reviewed this encounter.   Rhys Martini, PA-C 02/15/21 1133

## 2021-03-06 ENCOUNTER — Emergency Department (HOSPITAL_COMMUNITY)
Admission: EM | Admit: 2021-03-06 | Discharge: 2021-03-06 | Disposition: A | Discharge status: Home / Self Care | Payer: Medicaid Other | Attending: Emergency Medicine | Admitting: Emergency Medicine

## 2021-03-06 ENCOUNTER — Other Ambulatory Visit: Payer: Self-pay

## 2021-03-06 ENCOUNTER — Encounter (HOSPITAL_COMMUNITY): Payer: Self-pay | Admitting: Emergency Medicine

## 2021-03-06 DIAGNOSIS — R102 Pelvic and perineal pain: Secondary | ICD-10-CM | POA: Diagnosis present

## 2021-03-06 DIAGNOSIS — R112 Nausea with vomiting, unspecified: Secondary | ICD-10-CM | POA: Diagnosis not present

## 2021-03-06 LAB — URINALYSIS, ROUTINE W REFLEX MICROSCOPIC
Bilirubin Urine: NEGATIVE
Glucose, UA: NEGATIVE mg/dL
Hgb urine dipstick: NEGATIVE
Ketones, ur: 5 mg/dL — AB
Leukocytes,Ua: NEGATIVE
Nitrite: NEGATIVE
Protein, ur: NEGATIVE mg/dL
Specific Gravity, Urine: 1.02 (ref 1.005–1.030)
pH: 5 (ref 5.0–8.0)

## 2021-03-06 LAB — CBC
HCT: 43.8 % (ref 36.0–46.0)
Hemoglobin: 14.3 g/dL (ref 12.0–15.0)
MCH: 31.4 pg (ref 26.0–34.0)
MCHC: 32.6 g/dL (ref 30.0–36.0)
MCV: 96.3 fL (ref 80.0–100.0)
Platelets: 277 10*3/uL (ref 150–400)
RBC: 4.55 MIL/uL (ref 3.87–5.11)
RDW: 12.4 % (ref 11.5–15.5)
WBC: 6.8 10*3/uL (ref 4.0–10.5)
nRBC: 0 % (ref 0.0–0.2)

## 2021-03-06 LAB — I-STAT BETA HCG BLOOD, ED (MC, WL, AP ONLY): I-stat hCG, quantitative: <5 m[IU]/mL (ref <5)

## 2021-03-06 LAB — COMPREHENSIVE METABOLIC PANEL
ALT: 16 U/L (ref 0–44)
AST: 17 U/L (ref 15–41)
Albumin: 4.1 g/dL (ref 3.5–5.0)
Alkaline Phosphatase: 52 U/L (ref 38–126)
Anion gap: 7 (ref 5–15)
BUN: 6 mg/dL (ref 6–20)
CO2: 27 mmol/L (ref 22–32)
Calcium: 9.1 mg/dL (ref 8.9–10.3)
Chloride: 104 mmol/L (ref 98–111)
Creatinine, Ser: 0.64 mg/dL (ref 0.44–1.00)
GFR, Estimated: >60 mL/min (ref >60)
Glucose, Bld: 93 mg/dL (ref 70–99)
Potassium: 3.8 mmol/L (ref 3.5–5.1)
Sodium: 138 mmol/L (ref 135–145)
Total Bilirubin: 1.1 mg/dL (ref 0.3–1.2)
Total Protein: 7.1 g/dL (ref 6.5–8.1)

## 2021-03-06 LAB — WET PREP, GENITAL
Sperm: NONE SEEN
Trich, Wet Prep: NONE SEEN
Yeast Wet Prep HPF POC: NONE SEEN

## 2021-03-06 LAB — LIPASE, BLOOD: Lipase: 30 U/L (ref 11–51)

## 2021-03-06 MED ORDER — ONDANSETRON 4 MG PO TBDP: 4.0000 mg | ORAL_TABLET | Freq: Three times a day (TID) | ORAL | 0 refills | Status: DC | PRN

## 2021-03-06 MED ORDER — OMEPRAZOLE 40 MG PO CPDR: 40.0000 mg | DELAYED_RELEASE_CAPSULE | Freq: Every day | ORAL | 0 refills | Status: DC

## 2021-03-06 MED ORDER — KETOROLAC TROMETHAMINE 60 MG/2ML IM SOLN
60.0000 mg | Freq: Once | INTRAMUSCULAR | Status: AC
  Administered 2021-03-06: 60 mg via INTRAMUSCULAR
  Filled 2021-03-06: qty 2

## 2021-03-06 NOTE — ED Provider Notes (Signed)
[facility] EMERGENCY DEPARTMENT Provider Note   CSN: [id number] Arrival date & time: 03/06/21  1422     History Chief Complaint  Patient presents with  . Pelvic Pain    Donna Fernandez is a 20 y.o. female with history of uncomplicated vaginal delivery 2019 presents to ER for evaluation of pelvic pain that began 2 weeks ago. It has been intermittent, diffusely but worse on the right and left. Feels like a cramp. Currently 8/10.  Has taken ibuprofen and acetaminophen with some temporary relief.  States she is 2 days late on her period which typically comes on every 28 days reliably.  Had influenza less than 1 month ago, symptoms have resolved.  Had thick vaginal discharge 2 weeks ago but this resolved.  Has had 2 negative pregnancy tests at home. She is sexually active with one female partner with condom use 100% of the time.  Denies concern for STD. Has had intermittent nausea and vomiting as well over the last 2 weeks with this. Vomited once this morning but it was just "bubbles".  Reports some acid reflux. No fever. No dysuria, hematuria, frequency. Had some blood in her stool a few weeks ago but knows she has hemorrhoids from pregnancy, this also resolved.  History of appendectomy.   HPI     Past Medical History:  Diagnosis Date  . Anemia   . Medical history non-contributory   . Otitis media, recurrent     Patient Active Problem List   Diagnosis Date Noted  . 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 Normal labor 04/05/2018  . Preterm contractions 03/07/2018  . Myopia 09/02/2014  . BMI (body mass index), pediatric, 85% to less than 95% for age 70/30/2015    Past Surgical History:  Procedure Laterality Date  . ADENOIDECTOMY    . APPENDECTOMY    . LAPAROSCOPIC APPENDECTOMY N/A 07/29/2013   Procedure: APPENDECTOMY LAPAROSCOPIC;  Surgeon: Judie Petit. Leonia Corona, MD;  Location: [facility];  Service: Pediatrics;  Laterality: N/A;  . TYMPANOSTOMY TUBE PLACEMENT        OB History    Gravida  1   Para  1   Term  1   Preterm      AB      Living  1     SAB      IAB      Ectopic      Multiple  0   Live Births  1           Family History  Problem Relation Age of Onset  . Asthma Mother   . Diabetes type II Maternal Uncle   . Diabetes type II Maternal Aunt     Social History   Tobacco Use  . Smoking status: Never Smoker  . Smokeless tobacco: Never Used  Vaping Use  . Vaping Use: Never used  Substance Use Topics  . Alcohol use: No    Alcohol/week: 0.0 standard drinks  . Drug use: No    Home Medications Prior to Admission medications   Medication Sig Start Date End Date Taking? Authorizing Provider  omeprazole (PRILOSEC) 40 MG capsule Take 1 capsule (40 mg total) by mouth daily. 03/06/21 04/05/21 Yes Sharen Heck J, PA-C  ondansetron (ZOFRAN ODT) 4 MG disintegrating tablet Take 1 tablet (4 mg total) by mouth every 8 (eight) hours as needed for nausea or vomiting. 03/06/21  Yes Liberty Handy, PA-C  albuterol (VENTOLIN HFA) 108 (90 Base) MCG/ACT inhaler Inhale  1-2 puffs into the lungs every 6 (six) hours as needed for wheezing or shortness of breath. 02/15/21   Rhys Martini, PA-C  benzonatate (TESSALON) 100 MG capsule Take 1 capsule (100 mg total) by mouth every 8 (eight) hours. 02/15/21   Rhys Martini, PA-C  promethazine-dextromethorphan (PROMETHAZINE-DM) 6.25-15 MG/5ML syrup Take 5 mLs by mouth 4 (four) times daily as needed for cough. 02/15/21   Rhys Martini, PA-C  Norethindrone Acetate-Ethinyl Estradiol (JUNEL 1.5/30) 1.5-30 MG-MCG tablet Take 1 tablet by mouth daily. 05/20/20 02/15/21  Georges Mouse, NP    Allergies    Patient has no known allergies.  Review of Systems   Review of Systems  Gastrointestinal: Positive for nausea and vomiting.  Genitourinary: Positive for pelvic pain.  All other systems reviewed and are negative.   Physical Exam Updated Vital Signs BP 102/63   Pulse (!) 106   Temp  98.9 F (37.2 C)   Resp 16   LMP 02/05/2021   SpO2 93%   Physical Exam Vitals and nursing note reviewed.  Constitutional:      Appearance: She is well-developed.     Comments: Non toxic in NAD  HENT:     Head: Normocephalic and atraumatic.     Nose: Nose normal.  Eyes:     Conjunctiva/sclera: Conjunctivae normal.  Cardiovascular:     Rate and Rhythm: Normal rate and regular rhythm.  Pulmonary:     Effort: Pulmonary effort is normal.     Breath sounds: Normal breath sounds.  Abdominal:     General: Bowel sounds are normal.     Palpations: Abdomen is soft.     Tenderness: There is abdominal tenderness.     Comments: Diffuse lower abdominal tenderness worse R/L. Minimal suprapubic tenderness. No G/R/R. No CVA tenderness. Negative Murphy's and McBurney's. Active BS to lower quadrants.   Genitourinary:    Comments:  Exam performed with EMT at bedside for assistance. External genitalia without lesions.  No groin lymphadenopathy.  Vaginal mucosa and cervix pink without lesions.  Scant likely physiologic clear/white discharge in vaginal vault noted.  No CMT.  Nonpalpable, nontender adnexa.  Perianal skin normal without lesions. Musculoskeletal:        General: Normal range of motion.     Cervical back: Normal range of motion.  Skin:    General: Skin is warm and dry.     Capillary Refill: Capillary refill takes less than 2 seconds.  Neurological:     Mental Status: She is alert.  Psychiatric:        Behavior: Behavior normal.     ED Results / Procedures / Treatments   Labs (all labs ordered are listed, but only abnormal results are displayed) Labs Reviewed  WET PREP, GENITAL - Abnormal; Notable for the following components:      Result Value   Clue Cells Wet Prep HPF POC PRESENT (*)    WBC, Wet Prep HPF POC MODERATE (*)    All other components within normal limits  URINALYSIS, ROUTINE W REFLEX MICROSCOPIC - Abnormal; Notable for the following components:   APPearance HAZY  (*)    Ketones, ur 5 (*)    All other components within normal limits  LIPASE, BLOOD  COMPREHENSIVE METABOLIC PANEL  CBC  I-STAT BETA HCG BLOOD, ED ([facility], [facility], [facility] ONLY)  GC/CHLAMYDIA PROBE AMP ([place]) NOT AT [facility]    EKG None  Radiology No results found.  Procedures Procedures   Medications Ordered in ED Medications  ketorolac (TORADOL) injection 60 mg (60 mg Intramuscular Given 03/06/21 1800)    ED Course  I have reviewed the triage vital signs and the nursing notes.  Pertinent labs & imaging results that were available during my care of the patient were reviewed by me and considered in my medical decision making (see chart for details).  Clinical Course as of 03/06/21 1801  Sun Mar 06, 2021  1800 Clue Cells Wet Prep HPF POC(!): PRESENT Asymptomatic  [CG]    Clinical Course User Index [CG] Liberty Handy, PA-C   MDM Rules/Calculators/A&P                          20 yo F here for evaluation of pelvic pain, nausea, vomiting for 2 weeks. Crampy. Pelvic exam unremarkable. No CMT. No adnexal fullness. No significant discharge.  Labs ordered by [facility] provider, personally interpreted.   Labs reveal - vastly reassuring, negative Hcg. Wet prep shows clue cells however patient without complaints of abnormal vaginal discharge, will defer treatment at this time   Given duration of symptoms >2 weeks, exam, benign work up doubt life threatening or surgical process.  She is afebrile, normal WBC.  S/p appendectomy. Doubt SBO, diverticulitis, TOA. No CMT.  She is late on her menses and may be related to PMS cramping. Will discharge with NSAIDs, zofran, PPI and PCP follow up. May need GYN referral. Return precautions given. Patient in agreement with POC.   Final Clinical Impression(s) / ED Diagnoses Final diagnoses:  Pelvic pain in female  Nausea and vomiting in adult    Rx / DC Orders ED Discharge Orders         Ordered    ondansetron (ZOFRAN ODT) 4 MG disintegrating tablet   Every 8 hours PRN        03/06/21 1800    omeprazole (PRILOSEC) 40 MG capsule  Daily        03/06/21 1800           Jerrell Mylar 03/06/21 1801    Gerhard Munch, MD 03/06/21 1815

## 2021-03-06 NOTE — ED Triage Notes (Signed)
Reports pelvic pain, lower back pain, nausea, and vomiting x 1 week.  Reports 2 negative home pregnancy test.

## 2021-03-06 NOTE — Discharge Instructions (Signed)
You were seen in the ER for pelvic pain, nausea, vomiting for 2 weeks  Pelvic exam was normal.  Swabs shows clue cells that can be seen with bacterial vaginosis however you denied any abnormal vaginal discharge, odor, etc. Gonorrhea, chlamydia testing is pending. Follow up on results on Mychart.  Cause of your pain is unclear.  Take ibuprofen or acetaminophen as needed. Zofran for nausea.  Monitor your symptoms, if they continue make appointment with PCP for re-evaluation, they may refer you to GYN or perform ultrasound  Return for worsening pain, persistent vomiting, fever, abnormal vaginal discharge or bleeding, diarrhea

## 2021-03-07 LAB — GC/CHLAMYDIA PROBE AMP (~~LOC~~) NOT AT ARMC
Chlamydia: POSITIVE — AB
Comment: NEGATIVE
Comment: NORMAL
Neisseria Gonorrhea: NEGATIVE

## 2021-03-08 ENCOUNTER — Encounter: Payer: Self-pay | Admitting: Pediatrics

## 2021-03-08 ENCOUNTER — Other Ambulatory Visit: Payer: Self-pay | Admitting: Pediatrics

## 2021-03-08 DIAGNOSIS — A749 Chlamydial infection, unspecified: Secondary | ICD-10-CM

## 2021-03-08 MED ORDER — DOXYCYCLINE MONOHYDRATE 100 MG PO TABS: 100.0000 mg | ORAL_TABLET | Freq: Two times a day (BID) | ORAL | 0 refills | Status: AC

## 2021-03-08 NOTE — Telephone Encounter (Signed)
Rx sent for treatment.  Please advise patient to abstain from sex for 7 days and to advise partner to present for treatment to their health provider.

## 2021-05-23 LAB — OB RESULTS CONSOLE VARICELLA ZOSTER ANTIBODY, IGG: Varicella: NON-IMMUNE/NOT IMMUNE

## 2021-05-23 LAB — OB RESULTS CONSOLE HIV ANTIBODY (ROUTINE TESTING): HIV: NONREACTIVE

## 2021-05-23 LAB — OB RESULTS CONSOLE GC/CHLAMYDIA
Chlamydia: NEGATIVE
Gonorrhea: NEGATIVE

## 2021-05-23 LAB — OB RESULTS CONSOLE RUBELLA ANTIBODY, IGM: Rubella: NON-IMMUNE/NOT IMMUNE

## 2021-05-23 LAB — HEPATITIS C ANTIBODY: HCV Ab: NEGATIVE

## 2021-06-06 ENCOUNTER — Encounter: Payer: Self-pay | Admitting: *Deleted

## 2021-06-07 ENCOUNTER — Other Ambulatory Visit: Payer: Self-pay | Admitting: *Deleted

## 2021-06-07 ENCOUNTER — Other Ambulatory Visit: Payer: Self-pay | Admitting: Obstetrics & Gynecology

## 2021-06-07 DIAGNOSIS — Z3A13 13 weeks gestation of pregnancy: Secondary | ICD-10-CM

## 2021-06-07 DIAGNOSIS — Z3682 Encounter for antenatal screening for nuchal translucency: Secondary | ICD-10-CM

## 2021-06-08 ENCOUNTER — Other Ambulatory Visit: Payer: Self-pay

## 2021-06-08 ENCOUNTER — Ambulatory Visit (HOSPITAL_BASED_OUTPATIENT_CLINIC_OR_DEPARTMENT_OTHER): Payer: Medicaid Other

## 2021-06-08 ENCOUNTER — Ambulatory Visit: Payer: Medicaid Other | Attending: Family Medicine

## 2021-06-08 ENCOUNTER — Ambulatory Visit: Payer: Medicaid Other

## 2021-06-08 ENCOUNTER — Other Ambulatory Visit: Payer: Self-pay | Admitting: *Deleted

## 2021-06-08 ENCOUNTER — Encounter: Payer: Self-pay | Admitting: *Deleted

## 2021-06-08 ENCOUNTER — Ambulatory Visit: Payer: Self-pay

## 2021-06-08 ENCOUNTER — Ambulatory Visit: Payer: Medicaid Other | Admitting: *Deleted

## 2021-06-08 DIAGNOSIS — Z3682 Encounter for antenatal screening for nuchal translucency: Secondary | ICD-10-CM

## 2021-06-08 DIAGNOSIS — Z3A13 13 weeks gestation of pregnancy: Secondary | ICD-10-CM

## 2021-06-08 DIAGNOSIS — Z822 Family history of deafness and hearing loss: Secondary | ICD-10-CM | POA: Diagnosis not present

## 2021-06-08 DIAGNOSIS — O99211 Obesity complicating pregnancy, first trimester: Secondary | ICD-10-CM | POA: Diagnosis not present

## 2021-06-08 DIAGNOSIS — E669 Obesity, unspecified: Secondary | ICD-10-CM

## 2021-06-08 DIAGNOSIS — Z36 Encounter for antenatal screening for chromosomal anomalies: Secondary | ICD-10-CM

## 2021-06-08 DIAGNOSIS — Z6830 Body mass index (BMI) 30.0-30.9, adult: Secondary | ICD-10-CM

## 2021-06-08 DIAGNOSIS — Z315 Encounter for genetic counseling: Secondary | ICD-10-CM

## 2021-06-08 NOTE — Progress Notes (Addendum)
Donna Fernandez, Shruti V Length of Consultation: 40 minutes   Ms. Gwyneth Sprout  was referred to [facility] for genetic counseling due to a family history of hearing loss and heart murmurs and to review prenatal screening and testing options.  This note summarizes the information we discussed.  The patient was alone for this appointment.  We offered the following routine screening tests for this pregnancy:  Cell free fetal DNA testing from maternal blood may be used to determine whether a baby is at high risk for Down syndrome, trisomy 64, or trisomy 69.  This test utilizes a maternal blood sample and DNA sequencing technology to isolate circulating cell free fetal DNA from maternal plasma.  The fetal DNA can then be analyzed for DNA sequences that are derived from the three most common chromosomes involved in aneuploidy, chromosomes 13, 18, and 21.  If the overall amount of DNA is greater than the expected level for any of these chromosomes, aneuploidy is suspected.  The detection rate for Down syndrome and trisomy 18 is >99% and the detection rate for trisomy 13 is >91%. While we do not consider it a replacement for invasive testing and karyotype analysis, a negative result from this testing would be reassuring, though not a guarantee of a normal chromosome complement for the baby.  An abnormal result is certainly suggestive of an abnormal chromosome complement, though we would still recommend CVS or amniocentesis to confirm any findings from this testing. This testing can also assess for the sex chromosomes and can detect approximately 96% of sex chromosome aneuploidies and determine fetal gender with >99% confidence.    Maternal serum marker screening, a blood test that measures pregnancy proteins, can provide risk assessments for Down syndrome, trisomy 18, and open neural tube defects (spina bifida, anencephaly). Because it does not directly examine the fetus, it cannot positively diagnose or  rule out these problems. An AFP only should be considered to screening for open neural tube defects if prior screening for chromosome conditions has been performed.  Targeted ultrasound uses high frequency sound waves to create an image of the developing fetus.  An ultrasound is often recommended as a routine means of evaluating the pregnancy.  It is also used to screen for fetal anatomy problems (for example, a heart defect) that might be suggestive of a chromosomal or other abnormality.   Should these screening tests indicate an increased concern, then the following additional testing options would be offered:  The chorionic villus sampling procedure is available for first trimester chromosome analysis.  This involves the withdrawal of a small amount of chorionic villi (tissue from the developing placenta).  Risk of pregnancy loss is estimated to be approximately 1 in 200 to 1 in 100 (0.5 to 1%).  There is approximately a 1% (1 in 100) chance that the CVS chromosome results will be unclear.  Chorionic villi cannot be tested for neural tube defects.     Amniocentesis involves the removal of a small amount of amniotic fluid from the sac surrounding the fetus with the use of a thin needle inserted through the maternal abdomen and uterus.  Ultrasound guidance is used throughout the procedure.  Fetal cells from amniotic fluid are directly evaluated and > 99.5% of chromosome problems and > 98% of open neural tube defects can be detected. This procedure is generally performed after the 15th week of pregnancy.  The main risks to this procedure include complications leading to miscarriage in less than 1 in 200  cases (0.5%).  Cystic Fibrosis and Spinal Muscular Atrophy (SMA) screening were also discussed with the patient. Both conditions are recessive, which means that both parents must be carriers in order to have a child with the disease.  Cystic fibrosis (CF) is one of the most common genetic conditions in  persons of Caucasian ancestry.  This condition occurs in approximately 1 in 2,500 Caucasian persons and results in thickened secretions in the lungs, digestive, and reproductive systems.  For a baby to be at risk for having CF, both of the parents must be carriers for this condition.  Approximately 1 in 31 Caucasian persons is a carrier for CF.  Current carrier testing looks for the most common mutations in the gene for CF and can detect approximately 90% of carriers in the Caucasian population.  This means that the carrier screening can greatly reduce, but cannot eliminate, the chance for an individual to have a child with CF.  If an individual is found to be a carrier for CF, then carrier testing would be available for the partner. As part of [place]'s newborn screening profile, all babies born in the state of [place] will have a two-tier screening process.  Specimens are first tested to determine the concentration of immunoreactive trypsinogen (IRT).  The top 5% of specimens with the highest IRT values then undergo DNA testing using a panel of over 40 common CF mutations. SMA is a neurodegenerative disorder that leads to atrophy of skeletal muscle and overall weakness.  This condition is also more prevalent in the Caucasian population, with 1 in 40-1 in 60 persons being a carrier and 1 in 6,000-1 in 10,000 children being affected.  There are multiple forms of the disease, with some causing death in infancy to other forms with survival into adulthood.  The genetics of SMA is complex, but carrier screening can detect up to 95% of carriers in the Caucasian population.  Similar to CF, a negative result can greatly reduce, but cannot eliminate, the chance to have a child with SMA. Hemoglobinopathy screening was also made available. Prior CF and hemoglobinopathy screening were performed in her last pregnancy (2018) per her records and were normal.  We obtained a detailed family history and pregnancy  history.  We discussed the following concerns in the family history: Hearing loss-Ms. 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  She continues to have tubes placed as needed.  She also has a paternal first cousin with congenital hearing loss.  She is 21 years old and lives in [place].  The patient is not aware of any other developmental or health concerns for this cousin and is not aware if she has had any formal genetics evaluations.  In addition, the patient's paternal great grandmother and some of her siblings are reported to also have had congenital hearing loss.  We reviewed that there may be many causes for hearing loss including genetic syndromes, single gene disorders, environmental causes and multifactorial conditions. Many of the genetic causes are due to recessive genes, though some causes may be dominant and variable.  Without additional information about the cause for the congenital hearing loss in her relatives, it is difficult to provide any accurate recurrence risk.  Because her hearing loss is related to infections and the need for tubes, then we would not expect a high recurrence in her children in the absence of similar presentation. If more genetic testing is desired,  we would suggest starting with her cousin to ensure the appropriate diagnosis, though carrier screening is available for some of the known recessive genes. Developmental delays- The patient reported a paternal first cousin with developmental differences and possible Down syndrome.  He does not have any facial features or medical concerns such as a heart defect.  He is described as being large in stature, having violent outbursts, sensory issues and delays with learning. We would recommend a formal genetics evaluation (or can review those records if this has been done) in order to determine the underlying cause for his condition and possible risks to other  family members. Heart murmurs-The patient's 48 year old daughter and her 29 year old sister both were diagnosed with heart murmurs in infancy which resolved by age 25 years.  One maternal aunt may also have heart problems but is it unclear the details or onset of her possible problems.  Structural heart defects are among the most common birth defects, occurring in 1 in 200 babies.  Murmurs are much more common that this and can occur for various reasons.  When a murmur resolves spontaneously, without surgery or other treatment, it is not expected to increase the risk for a structural heart defect in other family members.  We will evaluate the heart at the detailed anatomy ultrasound and will order a fetal echocardiogram if clinically indicated. Stillbirths-Ms. Gwyneth Sprout stated that her maternal great grandmother had three stillborn female babies.  No details are known as to the timing in pregnancy or if there were any physical differences about those babies.  She did go on to have two healthy sons and three healthy daughters.  There are no other family members with recurrent pregnancy loss or stillborn babies.  Because there may be various reasons for stillbirth, we cannot rule out a genetic condition that is lethal in males.  It is encouraging that there have been no other losses in the family. The remainder of the family history was reported to be unremarkable for birth defects, intellectual delays, recurrent pregnancy loss or known chromosome abnormalities.  Ms. Gwyneth Sprout reported no complications in this pregnancy.  She denied the use of prescription medications, recreational drugs, tobacco or alcohol in the pregnancy.  After consideration of the options, Ms. Gwyneth Sprout elected to proceed with cell free fetal DNA testing and to decline carrier screening for SMA.  An ultrasound was performed at the time of the visit.  The gestational age was consistent with 13 weeks.  Fetal anatomy could not be  assessed due to early gestational age.  Please refer to the ultrasound report for details of that study.  Ms. Gwyneth Sprout was encouraged to call with questions or concerns.  We can be contacted at [phone].  Plan of care: MaterniT21 drawn today. Declined SMA carrier screening. Return for detailed anatomy ultrasound in 6 weeks.  Consider fetal echo if cardiac views are abnormal, though risk for structural heart defect is expected to be low given only a murmur which resolved spontaneously in her daughter and sister. If more is learned about the cause for hearing loss or developmental differences in her family members, we are happy to discuss further.   Cherly Anderson, MS, CGC

## 2021-06-09 ENCOUNTER — Ambulatory Visit: Payer: Medicaid Other

## 2021-06-12 LAB — MATERNIT21 PLUS CORE+SCA
Fetal Fraction: 11
Monosomy X (Turner Syndrome): NOT DETECTED
Result (T21): NEGATIVE
Trisomy 13 (Patau syndrome): NEGATIVE
Trisomy 18 (Edwards syndrome): NEGATIVE
Trisomy 21 (Down syndrome): NEGATIVE
XXX (Triple X Syndrome): NOT DETECTED
XXY (Klinefelter Syndrome): NOT DETECTED
XYY (Jacobs Syndrome): NOT DETECTED

## 2021-06-15 ENCOUNTER — Telehealth: Payer: Self-pay | Admitting: Obstetrics and Gynecology

## 2021-06-15 NOTE — Telephone Encounter (Signed)
The patient was informed of the results of her recent MaterniT21 testing which yielded NEGATIVE results.  The patient's specimen showed DNA consistent with two copies of chromosomes 21, 18 and 13.  The sensitivity for trisomy 21, trisomy 18 and trisomy 13 using this testing are reported as 99.1%, 99.9% and 91.7% respectively.  Thus, while the results of this testing are highly accurate, they are not considered diagnostic at this time.  Should more definitive information be desired, the patient may still consider amniocentesis.   As requested to know by the patient, sex chromosome analysis was included for this sample.  Results are consistent with a female fetus. This is predicted with >99% accuracy. This testing also screens for sex chromosome conditions with greater than 96% accuracy and was negative for those conditions.   A maternal serum AFP only should be considered if screening for neural tube defects is desired.  We may be reached at [phone] with any questions or concerns.  Melane Windholz F. Carlyn Mullenbach, MS, CGC  

## 2021-06-15 NOTE — Telephone Encounter (Signed)
LM for pt to call back to get lab results.

## 2021-06-20 ENCOUNTER — Other Ambulatory Visit: Payer: Self-pay

## 2021-06-20 ENCOUNTER — Ambulatory Visit (HOSPITAL_COMMUNITY)
Admission: EM | Admit: 2021-06-20 | Discharge: 2021-06-20 | Disposition: A | Discharge status: Home / Self Care | Payer: Medicaid Other | Attending: Sports Medicine | Admitting: Sports Medicine

## 2021-06-20 ENCOUNTER — Encounter (HOSPITAL_COMMUNITY): Payer: Self-pay

## 2021-06-20 DIAGNOSIS — H60503 Unspecified acute noninfective otitis externa, bilateral: Secondary | ICD-10-CM

## 2021-06-20 MED ORDER — OFLOXACIN 0.3 % OT SOLN: 10.0000 [drp] | Freq: Two times a day (BID) | OTIC | 0 refills | Status: DC

## 2021-06-20 NOTE — Discharge Instructions (Addendum)
Ofloxacin drops --> 10 drops in each ear twice a day for 7 days  F/u with ENT (Dr. Jenne Pane)

## 2021-06-20 NOTE — ED Triage Notes (Signed)
Pt in with c/o bilateral ear pain and drainage from the left ear x 1 week   Pt has not been taking any medication due to pregnancy

## 2021-06-20 NOTE — ED Provider Notes (Signed)
[facility]    CSN: [id number] Arrival date & time: 06/20/21  1458      History   Chief Complaint Chief Complaint  Patient presents with   Otalgia    HPI Donna Fernandez is a 20 y.o. female.   Hx of tympanostomy tubes in both ears. Have replaced about 4 times. Had frequent ear infections as young child.  Follows with Dr. Jenne Pane (ENT) about 1-2 years ago, and was told had one of her ear tubes still in.  Left ear - pain and fullness x 1 week ago. Has been chewing on gum. Warm washcloth without relief. Discharge coming from ear, yellowish, pus. Otalgia. Denies any fever/chills. Feels like "under water" and needs to pop. Diminished hearing.  Right ear - symptoms started 2 days ago. Pain and feeling "under water". Diminished hearing, but not as bad as left ear. No headaches.  Feels her ear pain radiates into her jaw and neck b/l, L > R.  She denies any fever or chills.  She denies any sore throat, runny nose, congestion or other signs of illness.  She states she has not had an ear infection for a few years now.  Otherwise, feels well.  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 Leonia Corona, MD;  Location: [facility] OR;  Service: Pediatrics;  Laterality: N/A;   TYMPANOSTOMY TUBE PLACEMENT      OB History     Gravida  2   Para  1   Term  1   Preterm      AB      Living  1      SAB      IAB      Ectopic      Multiple  0   Live Births  1             Home Medications    Prior to Admission medications   Medication Sig Start Date End Date Taking? Authorizing Provider  ofloxacin (FLOXIN) 0.3 % OTIC solution Place 10 drops into both ears 2 (two) times daily for 7 days. 06/20/21 06/27/21 Yes Madelyn Brunner, DO  albuterol (VENTOLIN HFA) 108 (90 Base) MCG/ACT inhaler Inhale 1-2 puffs into the lungs every 6 (six) hours as needed for wheezing or shortness of breath. Patient not taking: Reported on 06/08/2021 02/15/21   Rhys Martini, PA-C  benzonatate (TESSALON) 100 MG capsule Take 1 capsule (100 mg total) by mouth every 8 (eight) hours. Patient not taking: Reported on 06/08/2021 02/15/21   Rhys Martini, PA-C  omeprazole (PRILOSEC) 40 MG capsule Take 1 capsule (40 mg total) by mouth daily. 03/06/21 04/05/21  Liberty Handy, PA-C  ondansetron (ZOFRAN ODT) 4 MG disintegrating tablet Take 1 tablet (4 mg total) by mouth every 8 (eight) hours as needed for nausea or vomiting. Patient not taking: Reported on 06/08/2021 03/06/21   Liberty Handy, PA-C  Prenatal Vit-Fe Fumarate-FA (  PRENATAL MULTIVITAMIN) TABS tablet Take 1 tablet by mouth daily at 12 noon.    [provider]  promethazine-dextromethorphan (PROMETHAZINE-DM) 6.25-15 MG/5ML syrup Take 5 mLs by mouth 4 (four) times daily as needed for cough. Patient not taking: Reported on 06/08/2021 02/15/21   Rhys Martini, PA-C  Norethindrone Acetate-Ethinyl Estradiol (JUNEL 1.5/30) 1.5-30 MG-MCG tablet Take 1 tablet by mouth daily. 05/20/20 02/15/21  Georges Mouse, NP    Family History Family History  Problem Relation Age of Onset   Asthma Mother    Diabetes type II Maternal Uncle    Diabetes type II Maternal Aunt     Social History Social History   Tobacco Use   Smoking status: Never   Smokeless tobacco: Never  Vaping Use   Vaping Use: Never used  Substance Use Topics   Alcohol use: No    Alcohol/week: 0.0 standard drinks   Drug use: No     Allergies   Patient has no  known allergies.   Review of Systems Review of Systems  Constitutional:  Negative for chills and fever.  HENT:  Positive for ear discharge and ear pain. Negative for congestion and sore throat.   Eyes:  Negative for discharge.  Respiratory:  Negative for cough and shortness of breath.   Cardiovascular:  Negative for chest pain.  Skin:  Negative for color change and rash.  Neurological:  Negative for dizziness and headaches.    Physical Exam Triage Vital Signs ED Triage Vitals  Enc Vitals Group     BP 06/20/21 1628 120/80     Pulse Rate 06/20/21 1628 84     Resp 06/20/21 1628 18     Temp 06/20/21 1628 98.6 F (37 C)     Temp Source 06/20/21 1628 Oral     SpO2 06/20/21 1628 98 %     Weight --      Height --      Head Circumference --      Peak Flow --      Pain Score 06/20/21 1627 10     Pain Loc --      Pain Edu? --      Excl. in GC? --    No data found.  Updated Vital Signs BP 120/80 (BP Location: Left Arm)   Pulse 84   Temp 98.6 F (37 C) (Oral)   Resp 18   LMP 03/09/2021   SpO2 98%   Physical Exam Gen: Well-appearing, in no acute distress; non-toxic HENT: Normocephalic, atraumatic.  Eyes with white sclera, no conjunctivitis.  Nares patent without discharge.  Oropharynx pink without erythema, exudates or signs of infection. Ears: Left outer ear canal with erythema, yellow drainage/pus present.  There is a blue tympanostomy tube in place with surrounding yellow drainage.  Pain with manipulation of left ear.  Right ear with TM intact without bulging.  Prior tympanostomy tube scar noted of membrane.  Mild erythema of the outer canal, no pus or drainage noted. CV: Regular Rate. Well-perfused. Warm.  Resp: Breathing unlabored on room air; no wheezing. Psych: Fluid speech in conversation; appropriate affect; normal thought process    UC Treatments / Results  Labs (all labs ordered are listed, but only abnormal results are displayed) Labs Reviewed - No data to  display   Medications Ordered in UC Medications - No data to display  Initial Impression / Assessment and Plan / UC Course  I have reviewed the triage vital signs and the nursing notes.  Pertinent labs &  imaging results that were available during my care of the patient were reviewed by me and considered in my medical decision making (see chart for details).    Otitis externa, b/l (L > R) - acute infection History of tympanostomy tubes, left in place  Patient with bilateral acute otitis externa infection, left greater than right.  Left with EAC erythema, pus and drainage noted surrounding tympanostomy tube.  No systemic symptoms of fever/chills, or other URI symptoms.  Patient has had multiple tympanostomy tubes since childhood.  We will treat with ofloxacin otic drops 10 drops into bilateral ears twice daily for 7 days.  She may gently clean the drainage with warm washcloth and suction.  Avoid putting anything into the eardrum. I did discuss the importance of following up with her ENT doctor, Dr. Jenne Pane, following this treatment to see what type of management he would like in terms of her tympanostomy tubes and management going forward.  Return precautions provided in regards to her ears or if she develops any further systemic signs of illness, to return here to urgent care or ED.  Patient safe for discharge home. Final Clinical Impressions(s) / UC Diagnoses   Final diagnoses:  Acute otitis externa of both ears, unspecified type     Discharge Instructions      Ofloxacin drops --> 10 drops in each ear twice a day for 7 days  F/u with ENT (Dr. Jenne Pane)      ED Prescriptions     Medication Sig Dispense Auth. Provider   ofloxacin (FLOXIN) 0.3 % OTIC solution Place 10 drops into both ears 2 (two) times daily for 7 days. 7 mL Madelyn Brunner, DO      PDMP not reviewed this encounter.   Madelyn Brunner, DO 06/20/21 1727

## 2021-06-24 ENCOUNTER — Telehealth (HOSPITAL_COMMUNITY): Payer: Self-pay | Admitting: Emergency Medicine

## 2021-06-24 MED ORDER — OFLOXACIN 0.3 % OT SOLN: 10.0000 [drp] | Freq: Two times a day (BID) | OTIC | 0 refills | Status: AC

## 2021-06-24 NOTE — Telephone Encounter (Signed)
RX resent to [facility] on [address] per pt request. Pt notified.

## 2021-07-17 ENCOUNTER — Other Ambulatory Visit: Payer: Self-pay

## 2021-07-17 ENCOUNTER — Ambulatory Visit (HOSPITAL_COMMUNITY)
Admission: EM | Admit: 2021-07-17 | Discharge: 2021-07-17 | Disposition: A | Discharge status: Home / Self Care | Payer: Medicaid Other | Attending: Student | Admitting: Student

## 2021-07-17 ENCOUNTER — Encounter (HOSPITAL_COMMUNITY): Payer: Self-pay | Admitting: *Deleted

## 2021-07-17 DIAGNOSIS — O210 Mild hyperemesis gravidarum: Secondary | ICD-10-CM

## 2021-07-17 DIAGNOSIS — H66005 Acute suppurative otitis media without spontaneous rupture of ear drum, recurrent, left ear: Secondary | ICD-10-CM | POA: Diagnosis not present

## 2021-07-17 DIAGNOSIS — Z1152 Encounter for screening for COVID-19: Secondary | ICD-10-CM | POA: Insufficient documentation

## 2021-07-17 DIAGNOSIS — O99891 Other specified diseases and conditions complicating pregnancy: Secondary | ICD-10-CM | POA: Diagnosis not present

## 2021-07-17 DIAGNOSIS — Z9889 Other specified postprocedural states: Secondary | ICD-10-CM | POA: Diagnosis present

## 2021-07-17 DIAGNOSIS — Z3A18 18 weeks gestation of pregnancy: Secondary | ICD-10-CM | POA: Diagnosis present

## 2021-07-17 DIAGNOSIS — R112 Nausea with vomiting, unspecified: Secondary | ICD-10-CM | POA: Diagnosis present

## 2021-07-17 LAB — SARS CORONAVIRUS 2 (TAT 6-24 HRS): SARS Coronavirus 2: POSITIVE — AB

## 2021-07-17 MED ORDER — AMOXICILLIN 500 MG PO TABS: 500.0000 mg | ORAL_TABLET | Freq: Two times a day (BID) | ORAL | 0 refills | Status: AC

## 2021-07-17 NOTE — Discharge Instructions (Addendum)
-  Amoxicillin 500 mg twice daily for 10 days.  You can take this with food like breakfast and dinner. -Also keep the ear dry -Follow-up with your OB as scheduled tomorrow, your ear doctor as scheduled on Tuesday. -With a virus, you're typically contagious for 5-7 days, or as long as you're having fevers.

## 2021-07-17 NOTE — ED Triage Notes (Signed)
Pt report N/V/D since last night and body aches.

## 2021-07-17 NOTE — ED Provider Notes (Signed)
[facility]    CSN: [id number] Arrival date & time: 07/17/21  1521      History   Chief Complaint Chief Complaint  Patient presents with   Dizziness   Generalized Body Aches   Diarrhea   Emesis    HPI Donna Fernandez is a 20 y.o. female presenting with viral symptoms for 2 days following exposure to COVID at work.  Medical history recurrent otitis media left ear, tympanostomy tube in place, and hearing loss of left ear.  She is followed by ENT for this and has an appointment with them on 9/20.  Endorses increased left ear pain and muffled hearing in the last few days.  Was last at our urgent care about 1 month ago and prescribed amoxicillin, she states this only helped somewhat. Also with sore throat.  Crampy abdominal pain, 1 episode of nausea and vomiting this morning, few episodes of watery diarrhea today.  This patient is [redacted] weeks pregnant, denies significant abdominal pain or vaginal bleeding.  She has a follow-up with her OB on 9/19.  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 Leonia Corona, MD;  Location: [facility];  Service: Pediatrics;  Laterality: N/A;   TYMPANOSTOMY TUBE PLACEMENT      OB History     Gravida  2   Para  1   Term  1   Preterm      AB      Living  1      SAB      IAB      Ectopic      Multiple  0   Live Births  1            Home Medications    Prior to Admission medications   Medication Sig Start  Date End Date Taking? Authorizing Provider  amoxicillin (AMOXIL) 500 MG tablet Take 1 tablet (500 mg total) by mouth 2 (two) times daily for 10 days. 07/17/21 07/27/21 Yes Rhys Martini, PA-C  omeprazole (PRILOSEC) 40 MG capsule Take 1 capsule (40 mg total) by mouth daily. 03/06/21 04/05/21  Liberty Handy, PA-C  Prenatal Vit-Fe Fumarate-FA (PRENATAL MULTIVITAMIN) TABS tablet Take 1 tablet by mouth daily at 12 noon.    [provider]  Norethindrone Acetate-Ethinyl Estradiol (JUNEL 1.5/30) 1.5-30 MG-MCG tablet Take 1 tablet by mouth daily. 05/20/20 02/15/21  Georges Mouse, NP    Family History Family History  Problem Relation Age of Onset   Asthma Mother    Diabetes type II Maternal Uncle    Diabetes type II Maternal Aunt     Social History Social History   Tobacco Use   Smoking status: Never   Smokeless tobacco: Never  Vaping Use   Vaping Use: Never used  Substance Use Topics   Alcohol use: No    Alcohol/week: 0.0 standard drinks   Drug use: No  Allergies   Patient has no known allergies.   Review of Systems Review of Systems  Constitutional:  Negative for appetite change, chills and fever.  HENT:  Positive for sore throat. Negative for congestion, ear pain, rhinorrhea, sinus pressure and sinus pain.   Eyes:  Negative for redness and visual disturbance.  Respiratory:  Negative for cough, chest tightness, shortness of breath and wheezing.   Cardiovascular:  Negative for chest pain and palpitations.  Gastrointestinal:  Positive for abdominal pain, diarrhea, nausea and vomiting. Negative for constipation.  Genitourinary:  Negative for dysuria, frequency and urgency.  Musculoskeletal:  Negative for myalgias.  Neurological:  Negative for dizziness, weakness and headaches.  Psychiatric/Behavioral:  Negative for confusion.   All other systems reviewed and are negative.   Physical Exam Triage Vital Signs ED Triage Vitals  Enc Vitals Group     BP 07/17/21  1557 125/78     Pulse Rate 07/17/21 1557 (!) 112     Resp 07/17/21 1557 20     Temp 07/17/21 1557 99.2 F (37.3 C)     Temp src --      SpO2 07/17/21 1557 95 %     Weight --      Height --      Head Circumference --      Peak Flow --      Pain Score 07/17/21 1559 6     Pain Loc --      Pain Edu? --      Excl. in GC? --    No data found.  Updated Vital Signs BP 125/78   Pulse (!) 112   Temp 99.2 F (37.3 C)   Resp 20   LMP 03/09/2021   SpO2 95%   Visual Acuity Right Eye Distance:   Left Eye Distance:   Bilateral Distance:    Right Eye Near:   Left Eye Near:    Bilateral Near:     Physical Exam Vitals reviewed.  Constitutional:      General: She is not in acute distress.    Appearance: Normal appearance. She is not ill-appearing.  HENT:     Head: Normocephalic and atraumatic.     Right Ear: Tympanic membrane, ear canal and external ear normal. No tenderness. No middle ear effusion. There is no impacted cerumen. Tympanic membrane is not perforated, erythematous, retracted or bulging.     Left Ear: Tympanic membrane, ear canal and external ear normal. No tenderness.  No middle ear effusion. There is no impacted cerumen. Tympanic membrane is not perforated, erythematous, retracted or bulging.     Nose: Nose normal. No congestion.     Mouth/Throat:     Mouth: Mucous membranes are moist.     Pharynx: Uvula midline. No oropharyngeal exudate or posterior oropharyngeal erythema.  Eyes:     Extraocular Movements: Extraocular movements intact.     Pupils: Pupils are equal, round, and reactive to light.  Cardiovascular:     Rate and Rhythm: Regular rhythm. Tachycardia present.     Heart sounds: Normal heart sounds.  Pulmonary:     Effort: Pulmonary effort is normal.     Breath sounds: Normal breath sounds. No decreased breath sounds, wheezing, rhonchi or rales.  Abdominal:     Palpations: Abdomen is soft.     Tenderness: There is no abdominal tenderness. There is no  right CVA tenderness, left CVA tenderness, guarding or rebound. Negative signs include Murphy's sign, Rovsing's sign and McBurney's sign.     Comments:  Mildly tender throughout, without guarding or rebound. BS increased.  Neurological:     General: No focal deficit present.     Mental Status: She is alert and oriented to person, place, and time.  Psychiatric:        Mood and Affect: Mood normal.        Behavior: Behavior normal.        Thought Content: Thought content normal.        Judgment: Judgment normal.     UC Treatments / Results  Labs (all labs ordered are listed, but only abnormal results are displayed) Labs Reviewed  SARS CORONAVIRUS 2 (TAT 6-24 HRS)    EKG   Radiology No results found.  Procedures Procedures (including critical care time)  Medications Ordered in UC Medications - No data to display  Initial Impression / Assessment and Plan / UC Course  I have reviewed the triage vital signs and the nursing notes.  Pertinent labs & imaging results that were available during my care of the patient were reviewed by me and considered in my medical decision making (see chart for details).     This patient is a very pleasant 20 y.o. year old female presenting with viral symptoms x2 days following covid exposure. Today this pt is tachycardic but afebrile nontachypneic, oxygenating well on room air, no wheezes rhonchi or rales. Appears well hydrated. History appendectomy. This patient is [redacted] weeks pregnant.  COVID PCR sent. Amoxicillin for recurrent AOM.  Declines phenergan suppository for nausea.   Work note provided. ED return precautions discussed. Patient verbalizes understanding and agreement.   Already has OB follow-up on 9/19, and ear nose and throat follow-up on 9/20.  Coding Level 4 for acute illness with systemic symptoms, and prescription drug management  Final Clinical Impressions(s) / UC Diagnoses   Final diagnoses:  Recurrent acute suppurative  otitis media without spontaneous rupture of left tympanic membrane  Encounter for screening for COVID-19  [redacted] weeks gestation of pregnancy  Non-intractable vomiting with nausea, unspecified vomiting type  Hx of tympanostomy tubes     Discharge Instructions      -Amoxicillin 500 mg twice daily for 10 days.  You can take this with food like breakfast and dinner. -Also keep the ear dry -Follow-up with your OB as scheduled tomorrow, your ear doctor as scheduled on Tuesday. -With a virus, you're typically contagious for 5-7 days, or as long as you're having fevers.       ED Prescriptions     Medication Sig Dispense Auth. Provider   amoxicillin (AMOXIL) 500 MG tablet Take 1 tablet (500 mg total) by mouth 2 (two) times daily for 10 days. 20 tablet Rhys Martini, PA-C      PDMP not reviewed this encounter.   Rhys Martini, PA-C 07/17/21 1649

## 2021-07-20 ENCOUNTER — Ambulatory Visit: Payer: Medicaid Other

## 2021-07-20 ENCOUNTER — Other Ambulatory Visit: Payer: Medicaid Other

## 2021-08-04 ENCOUNTER — Encounter: Payer: Self-pay | Admitting: *Deleted

## 2021-08-04 ENCOUNTER — Ambulatory Visit (HOSPITAL_BASED_OUTPATIENT_CLINIC_OR_DEPARTMENT_OTHER): Payer: Medicaid Other

## 2021-08-04 ENCOUNTER — Other Ambulatory Visit: Payer: Self-pay

## 2021-08-04 ENCOUNTER — Ambulatory Visit: Payer: Medicaid Other | Attending: Obstetrics & Gynecology | Admitting: *Deleted

## 2021-08-04 VITALS — BP 110/54 | HR 85

## 2021-08-04 DIAGNOSIS — Z3689 Encounter for other specified antenatal screening: Secondary | ICD-10-CM

## 2021-08-04 DIAGNOSIS — Z6830 Body mass index (BMI) 30.0-30.9, adult: Secondary | ICD-10-CM

## 2021-08-04 DIAGNOSIS — Z3A21 21 weeks gestation of pregnancy: Secondary | ICD-10-CM

## 2021-08-04 DIAGNOSIS — E669 Obesity, unspecified: Secondary | ICD-10-CM | POA: Diagnosis not present

## 2021-08-04 DIAGNOSIS — O99212 Obesity complicating pregnancy, second trimester: Secondary | ICD-10-CM | POA: Insufficient documentation

## 2021-08-05 ENCOUNTER — Other Ambulatory Visit: Payer: Self-pay | Admitting: *Deleted

## 2021-08-05 DIAGNOSIS — Z362 Encounter for other antenatal screening follow-up: Secondary | ICD-10-CM

## 2021-08-31 ENCOUNTER — Encounter: Payer: Self-pay | Admitting: *Deleted

## 2021-08-31 ENCOUNTER — Other Ambulatory Visit: Payer: Self-pay

## 2021-08-31 ENCOUNTER — Ambulatory Visit: Payer: Medicaid Other | Attending: Obstetrics and Gynecology

## 2021-08-31 ENCOUNTER — Ambulatory Visit: Payer: Medicaid Other | Admitting: *Deleted

## 2021-08-31 VITALS — BP 116/66 | HR 90

## 2021-08-31 DIAGNOSIS — O99212 Obesity complicating pregnancy, second trimester: Secondary | ICD-10-CM

## 2021-08-31 DIAGNOSIS — Z362 Encounter for other antenatal screening follow-up: Secondary | ICD-10-CM | POA: Diagnosis not present

## 2021-08-31 DIAGNOSIS — E669 Obesity, unspecified: Secondary | ICD-10-CM | POA: Diagnosis not present

## 2021-08-31 DIAGNOSIS — Z3A25 25 weeks gestation of pregnancy: Secondary | ICD-10-CM | POA: Diagnosis not present

## 2021-09-01 ENCOUNTER — Other Ambulatory Visit: Payer: Self-pay | Admitting: *Deleted

## 2021-09-01 DIAGNOSIS — U071 COVID-19: Secondary | ICD-10-CM

## 2021-09-01 DIAGNOSIS — O98512 Other viral diseases complicating pregnancy, second trimester: Secondary | ICD-10-CM

## 2021-09-01 DIAGNOSIS — Z3689 Encounter for other specified antenatal screening: Secondary | ICD-10-CM

## 2021-10-19 ENCOUNTER — Ambulatory Visit: Payer: Medicaid Other | Attending: Obstetrics and Gynecology

## 2021-10-19 ENCOUNTER — Ambulatory Visit: Payer: Medicaid Other | Admitting: *Deleted

## 2021-10-19 ENCOUNTER — Other Ambulatory Visit: Payer: Self-pay

## 2021-10-19 VITALS — BP 119/69 | HR 91

## 2021-10-19 DIAGNOSIS — O98512 Other viral diseases complicating pregnancy, second trimester: Secondary | ICD-10-CM

## 2021-10-19 DIAGNOSIS — E669 Obesity, unspecified: Secondary | ICD-10-CM | POA: Diagnosis not present

## 2021-10-19 DIAGNOSIS — Z3689 Encounter for other specified antenatal screening: Secondary | ICD-10-CM

## 2021-10-19 DIAGNOSIS — Z3A32 32 weeks gestation of pregnancy: Secondary | ICD-10-CM

## 2021-10-19 DIAGNOSIS — U071 COVID-19: Secondary | ICD-10-CM

## 2021-10-19 DIAGNOSIS — O99213 Obesity complicating pregnancy, third trimester: Secondary | ICD-10-CM

## 2021-10-19 DIAGNOSIS — O98513 Other viral diseases complicating pregnancy, third trimester: Secondary | ICD-10-CM | POA: Diagnosis not present

## 2021-10-28 ENCOUNTER — Inpatient Hospital Stay (HOSPITAL_COMMUNITY)
Admission: AD | Admit: 2021-10-28 | Discharge: 2021-10-29 | Disposition: A | Discharge status: Home / Self Care | Payer: Medicaid Other | Attending: Obstetrics and Gynecology | Admitting: Obstetrics and Gynecology

## 2021-10-28 ENCOUNTER — Other Ambulatory Visit: Payer: Self-pay

## 2021-10-28 DIAGNOSIS — O4703 False labor before 37 completed weeks of gestation, third trimester: Secondary | ICD-10-CM | POA: Insufficient documentation

## 2021-10-28 DIAGNOSIS — Z3A33 33 weeks gestation of pregnancy: Secondary | ICD-10-CM | POA: Insufficient documentation

## 2021-10-28 NOTE — MAU Note (Signed)
PT SAYS SHE STARTED UC'S ON WED Thursday WAS UC AT WORK AT [facility] TODAY- WENT TO WORK - FELLOW WORKERS TOLD TO COME HERE  PNC WITH HD  LAST SEX- July

## 2021-10-29 ENCOUNTER — Encounter (HOSPITAL_COMMUNITY): Payer: Self-pay | Admitting: Obstetrics and Gynecology

## 2021-10-29 DIAGNOSIS — R102 Pelvic and perineal pain: Secondary | ICD-10-CM | POA: Diagnosis present

## 2021-10-29 DIAGNOSIS — Z3A33 33 weeks gestation of pregnancy: Secondary | ICD-10-CM | POA: Diagnosis not present

## 2021-10-29 DIAGNOSIS — O4703 False labor before 37 completed weeks of gestation, third trimester: Secondary | ICD-10-CM

## 2021-10-29 LAB — URINALYSIS, ROUTINE W REFLEX MICROSCOPIC
Bilirubin Urine: NEGATIVE
Glucose, UA: NEGATIVE mg/dL
Hgb urine dipstick: NEGATIVE
Ketones, ur: NEGATIVE mg/dL
Nitrite: NEGATIVE
Protein, ur: NEGATIVE mg/dL
Specific Gravity, Urine: 1.005 (ref 1.005–1.030)
pH: 7 (ref 5.0–8.0)

## 2021-10-29 LAB — WET PREP, GENITAL
Clue Cells Wet Prep HPF POC: NONE SEEN
Sperm: NONE SEEN
Trich, Wet Prep: NONE SEEN
WBC, Wet Prep HPF POC: >=10 — AB (ref <10)
Yeast Wet Prep HPF POC: NONE SEEN

## 2021-10-29 MED ORDER — NIFEDIPINE 10 MG PO CAPS
10.0000 mg | ORAL_CAPSULE | ORAL | Status: AC
  Administered 2021-10-29 (×2): 10 mg via ORAL
  Filled 2021-10-29 (×3): qty 1

## 2021-10-29 MED ORDER — DIPHENHYDRAMINE HCL 50 MG/ML IJ SOLN
25.0000 mg | Freq: Once | INTRAMUSCULAR | Status: AC
  Administered 2021-10-29: 25 mg via INTRAVENOUS
  Filled 2021-10-29: qty 1

## 2021-10-29 MED ORDER — BUTALBITAL-APAP-CAFFEINE 50-325-40 MG PO TABS
1.0000 | ORAL_TABLET | Freq: Once | ORAL | Status: AC
  Administered 2021-10-29: 1 via ORAL
  Filled 2021-10-29: qty 1

## 2021-10-29 MED ORDER — LACTATED RINGERS IV BOLUS
1000.0000 mL | Freq: Once | INTRAVENOUS | Status: AC
  Administered 2021-10-29: 1000 mL via INTRAVENOUS

## 2021-10-29 MED ORDER — METOCLOPRAMIDE HCL 5 MG/ML IJ SOLN
10.0000 mg | Freq: Once | INTRAMUSCULAR | Status: AC
  Administered 2021-10-29: 10 mg via INTRAVENOUS
  Filled 2021-10-29: qty 2

## 2021-10-29 NOTE — MAU Provider Note (Signed)
History     CSN: [id number]  Arrival date and time: 10/28/21 2201   Event Date/Time   First Provider Initiated Contact with Patient 10/29/21 0030      Chief Complaint  Patient presents with   Contractions   HPI [name] is a 20 y.o. year old G89P1001 female at [redacted]w[redacted]d weeks gestation who presents to MAU reporting contractions on Wednesday 10/26/2021 and Thursday 10/27/2021. She reports pelvic pressure, H/A 8/10, and nausea. She was at work today and was advised by co-workers to come to MAU to be evaluated. She receives [facility] with [facility]; next appt is 11/01/2021.  OB History     Gravida  2   Para  1   Term  1   Preterm      AB      Living  1      SAB      IAB      Ectopic      Multiple  0   Live Births  1           Past Medical History:  Diagnosis Date   Anemia    Medical history non-contributory    Otitis media, recurrent     Past Surgical History:  Procedure Laterality Date   ADENOIDECTOMY     APPENDECTOMY     LAPAROSCOPIC APPENDECTOMY N/A 07/29/2013   Procedure: APPENDECTOMY LAPAROSCOPIC;  Surgeon: Judie Petit. Leonia Corona, MD;  Location: [facility];  Service: Pediatrics;  Laterality: N/A;   TYMPANOSTOMY TUBE PLACEMENT      Family History  Problem Relation Age of Onset   Asthma Mother    Diabetes type II Maternal Uncle    Diabetes type II Maternal Aunt     Social History   Tobacco Use   Smoking status: Never   Smokeless tobacco: Never  Vaping Use   Vaping Use: Never used  Substance Use Topics   Alcohol use: No    Alcohol/week: 0.0 standard drinks   Drug use: No    Allergies: No Known Allergies  Medications Prior to Admission  Medication Sig Dispense Refill Last Dose   Prenatal Vit-Fe Fumarate-FA (PRENATAL MULTIVITAMIN) TABS tablet Take 1 tablet by mouth daily at 12 noon.   Past Week   omeprazole (PRILOSEC) 40 MG capsule Take 1 capsule (40 mg total) by mouth daily. 30 capsule 0     Review of Systems  Constitutional: Negative.    HENT: Negative.    Eyes: Negative.   Respiratory: Negative.    Cardiovascular: Negative.   Gastrointestinal: Negative.   Endocrine: Negative.   Genitourinary:  Positive for pelvic pain (pressure).  Musculoskeletal: Negative.   Skin: Negative.   Allergic/Immunologic: Negative.   Neurological: Negative.   Hematological: Negative.   Psychiatric/Behavioral: Negative.    Physical Exam   Blood pressure 103/61, pulse 92, temperature 98.5 F (36.9 C), temperature source Oral, resp. rate 17, height 5\' 1"  (1.549 m), weight 84.1 kg, last menstrual period 03/09/2021, SpO2 100 %, unknown if currently breastfeeding.  Physical Exam Vitals and nursing note reviewed. Exam conducted with a chaperone present.  Constitutional:      Appearance: Normal appearance. She is normal weight.  Neurological:     Mental Status: She is alert.    MAU Course  Procedures  MDM CCUA Wet Prep GC/CT -- Results pending  LR 1000 mL @ 999 mL/hr Reglan 10 mg IVP Benadryl 25 mg IVP Fioricet  1 tablet -- resolved H/A  Results for orders placed or performed during  the hospital encounter of 10/28/21 (from the past 24 hour(s))  Urinalysis, Routine w reflex microscopic Urine, Clean Catch     Status: Abnormal   Collection Time: 10/29/21 12:30 AM  Result Value Ref Range   Color, Urine YELLOW YELLOW   APPearance HAZY (A) CLEAR   Specific Gravity, Urine 1.005 1.005 - 1.030   pH 7.0 5.0 - 8.0   Glucose, UA NEGATIVE NEGATIVE mg/dL   Hgb urine dipstick NEGATIVE NEGATIVE   Bilirubin Urine NEGATIVE NEGATIVE   Ketones, ur NEGATIVE NEGATIVE mg/dL   Protein, ur NEGATIVE NEGATIVE mg/dL   Nitrite NEGATIVE NEGATIVE   Leukocytes,Ua LARGE (A) NEGATIVE   WBC, UA 6-10 0 - 5 WBC/hpf   Bacteria, UA MANY (A) NONE SEEN   Squamous Epithelial / LPF 11-20 0 - 5   Mucus PRESENT   Wet prep, genital     Status: Abnormal   Collection Time: 10/29/21 12:38 AM  Result Value Ref Range   Yeast Wet Prep HPF POC NONE SEEN NONE SEEN    Trich, Wet Prep NONE SEEN NONE SEEN   Clue Cells Wet Prep HPF POC NONE SEEN NONE SEEN   WBC, Wet Prep HPF POC >=10 (A) <10   Sperm NONE SEEN     Assessment and Plan  Preterm uterine contractions in third trimester, antepartum  - Advised to seek care for > 6 painful contractions - Information provided on PTL and preventing PTB   [redacted] weeks gestation of pregnancy   - Discharge patient - Keep scheduled appt with [facility] on 11/01/2021 - Patient verbalized an understanding of the plan of care and agrees.    Donna Fernandez, CNM 10/29/2021, 12:30 AM

## 2021-10-30 NOTE — L&D Delivery Note (Addendum)
OB/GYN Faculty Practice Delivery Note  Donna Fernandez is a 21 y.o. G2P1001 s/p SVD at [redacted]w[redacted]d. She was admitted for SROM.   ROM: 14h 46m with yellow lightly meconium-stained fluid GBS Status: Positive, PCN given  Delivery Date/Time: 2323 Delivery: Called to room and patient was complete and pushing. Head delivered LOA. No nuchal cord present. Shoulder and body delivered in usual fashion. Infant with spontaneous cry, placed on mother's abdomen, dried and stimulated. Cord clamped x 2 after 1-minute delay, and cut by maternal grandmother under my direct supervision. Cord blood drawn. Placenta delivered spontaneously with gentle cord traction. Fundus firm with massage and Pitocin. Labia, perineum, vagina, and cervix were inspected, found to have a 1st degree perineal laceration that was repaired with 3-0 vicryl in usual running fashion.  Placenta: Intact, 3VC, sent to L&D Complications: None Lacerations: 1st degree perineal EBL: 350 cc Analgesia: None  Infant: Female   APGARs 8 and 9   weight pending  Donna Fells, Donna Fernandez PGY-1 12/19/2021, 12:32 AM   Attestation of CNM Supervision of Resident: Evaluation and management procedures were performed by the [facility] Medicine Resident under my supervision. I was immediately present, gloved, and available for direct supervision, assistance and direction throughout this encounter.  I also confirm that I have verified the information documented in the residents note, and that I have also personally reperformed the pertinent components of the physical exam and all of the medical decision making activities.  I have also made any necessary editorial changes.  SVD 1st degree perineal 3-0 vicryl used; repaired for hemostasis  Donna Fernandez, CNM 12/19/2021 1:16 AM

## 2021-11-01 LAB — GC/CHLAMYDIA PROBE AMP (~~LOC~~) NOT AT ARMC
Chlamydia: NEGATIVE
Comment: NEGATIVE
Comment: NORMAL
Neisseria Gonorrhea: NEGATIVE

## 2021-12-15 ENCOUNTER — Other Ambulatory Visit: Payer: Self-pay | Admitting: Obstetrics and Gynecology

## 2021-12-15 DIAGNOSIS — O480 Post-term pregnancy: Secondary | ICD-10-CM

## 2021-12-16 ENCOUNTER — Ambulatory Visit: Payer: Medicaid Other | Attending: Obstetrics and Gynecology

## 2021-12-16 ENCOUNTER — Other Ambulatory Visit: Payer: Self-pay

## 2021-12-16 ENCOUNTER — Telehealth (HOSPITAL_COMMUNITY): Payer: Self-pay | Admitting: *Deleted

## 2021-12-16 DIAGNOSIS — O480 Post-term pregnancy: Secondary | ICD-10-CM | POA: Diagnosis not present

## 2021-12-16 NOTE — Telephone Encounter (Signed)
Preadmission screen  

## 2021-12-18 ENCOUNTER — Inpatient Hospital Stay (HOSPITAL_COMMUNITY)
Admission: AD | Admit: 2021-12-18 | Discharge: 2021-12-20 | DRG: 798 | Disposition: A | Discharge status: Home / Self Care | Payer: Medicaid Other | Attending: Obstetrics and Gynecology | Admitting: Obstetrics and Gynecology

## 2021-12-18 ENCOUNTER — Encounter (HOSPITAL_COMMUNITY): Payer: Self-pay | Admitting: Obstetrics and Gynecology

## 2021-12-18 ENCOUNTER — Other Ambulatory Visit: Payer: Self-pay

## 2021-12-18 DIAGNOSIS — O99820 Streptococcus B carrier state complicating pregnancy: Secondary | ICD-10-CM | POA: Diagnosis not present

## 2021-12-18 DIAGNOSIS — Z302 Encounter for sterilization: Secondary | ICD-10-CM | POA: Diagnosis not present

## 2021-12-18 DIAGNOSIS — Z79899 Other long term (current) drug therapy: Secondary | ICD-10-CM

## 2021-12-18 DIAGNOSIS — Z3009 Encounter for other general counseling and advice on contraception: Secondary | ICD-10-CM | POA: Diagnosis present

## 2021-12-18 DIAGNOSIS — O09899 Supervision of other high risk pregnancies, unspecified trimester: Secondary | ICD-10-CM

## 2021-12-18 DIAGNOSIS — Z2839 Other underimmunization status: Secondary | ICD-10-CM

## 2021-12-18 DIAGNOSIS — Z3A40 40 weeks gestation of pregnancy: Secondary | ICD-10-CM | POA: Diagnosis not present

## 2021-12-18 DIAGNOSIS — O99824 Streptococcus B carrier state complicating childbirth: Secondary | ICD-10-CM | POA: Diagnosis present

## 2021-12-18 DIAGNOSIS — Z20822 Contact with and (suspected) exposure to covid-19: Secondary | ICD-10-CM | POA: Diagnosis present

## 2021-12-18 DIAGNOSIS — Z23 Encounter for immunization: Secondary | ICD-10-CM | POA: Diagnosis not present

## 2021-12-18 DIAGNOSIS — O4202 Full-term premature rupture of membranes, onset of labor within 24 hours of rupture: Secondary | ICD-10-CM | POA: Diagnosis not present

## 2021-12-18 DIAGNOSIS — O480 Post-term pregnancy: Principal | ICD-10-CM | POA: Diagnosis present

## 2021-12-18 LAB — TYPE AND SCREEN
ABO/RH(D): O POS
Antibody Screen: NEGATIVE

## 2021-12-18 LAB — CBC
HCT: 39.1 % (ref 36.0–46.0)
Hemoglobin: 12.8 g/dL (ref 12.0–15.0)
MCH: 29 pg (ref 26.0–34.0)
MCHC: 32.7 g/dL (ref 30.0–36.0)
MCV: 88.5 fL (ref 80.0–100.0)
Platelets: 286 10*3/uL (ref 150–400)
RBC: 4.42 MIL/uL (ref 3.87–5.11)
RDW: 13.8 % (ref 11.5–15.5)
WBC: 9.5 10*3/uL (ref 4.0–10.5)
nRBC: 0 % (ref 0.0–0.2)

## 2021-12-18 LAB — RESP PANEL BY RT-PCR (FLU A&B, COVID) ARPGX2
Influenza A by PCR: NEGATIVE
Influenza B by PCR: NEGATIVE
SARS Coronavirus 2 by RT PCR: NEGATIVE

## 2021-12-18 LAB — POCT FERN TEST: POCT Fern Test: POSITIVE

## 2021-12-18 MED ORDER — ONDANSETRON HCL 4 MG/2ML IJ SOLN: 4.0000 mg | Freq: Four times a day (QID) | INTRAMUSCULAR | Status: DC | PRN

## 2021-12-18 MED ORDER — SOD CITRATE-CITRIC ACID 500-334 MG/5ML PO SOLN: 30.0000 mL | ORAL | Status: DC | PRN

## 2021-12-18 MED ORDER — LACTATED RINGERS IV SOLN: INTRAVENOUS | Status: DC

## 2021-12-18 MED ORDER — OXYCODONE-ACETAMINOPHEN 5-325 MG PO TABS: 2.0000 | ORAL_TABLET | ORAL | Status: DC | PRN

## 2021-12-18 MED ORDER — OXYCODONE-ACETAMINOPHEN 5-325 MG PO TABS: 1.0000 | ORAL_TABLET | ORAL | Status: DC | PRN

## 2021-12-18 MED ORDER — SODIUM CHLORIDE 0.9 % IV SOLN
5.0000 10*6.[IU] | Freq: Once | INTRAVENOUS | Status: AC
  Administered 2021-12-18: 5 10*6.[IU] via INTRAVENOUS
  Filled 2021-12-18: qty 5

## 2021-12-18 MED ORDER — FENTANYL CITRATE (PF) 100 MCG/2ML IJ SOLN
50.0000 ug | INTRAMUSCULAR | Status: DC | PRN
  Administered 2021-12-18: 100 ug via INTRAVENOUS
  Filled 2021-12-18 (×2): qty 2

## 2021-12-18 MED ORDER — OXYTOCIN-SODIUM CHLORIDE 30-0.9 UT/500ML-% IV SOLN
2.5000 [IU]/h | INTRAVENOUS | Status: DC
  Filled 2021-12-18: qty 500

## 2021-12-18 MED ORDER — PENICILLIN G POT IN DEXTROSE 60000 UNIT/ML IV SOLN
3.0000 10*6.[IU] | INTRAVENOUS | Status: DC
  Administered 2021-12-18: 3 10*6.[IU] via INTRAVENOUS
  Filled 2021-12-18: qty 50

## 2021-12-18 MED ORDER — LACTATED RINGERS IV SOLN: 500.0000 mL | INTRAVENOUS | Status: DC | PRN

## 2021-12-18 MED ORDER — LIDOCAINE HCL (PF) 1 % IJ SOLN
30.0000 mL | INTRAMUSCULAR | Status: AC | PRN
  Administered 2021-12-18: 30 mL via SUBCUTANEOUS
  Filled 2021-12-18: qty 30

## 2021-12-18 MED ORDER — ACETAMINOPHEN 325 MG PO TABS: 650.0000 mg | ORAL_TABLET | ORAL | Status: DC | PRN

## 2021-12-18 MED ORDER — OXYTOCIN BOLUS FROM INFUSION
333.0000 mL | Freq: Once | INTRAVENOUS | Status: AC
  Administered 2021-12-18: 333 mL via INTRAVENOUS

## 2021-12-18 NOTE — H&P (Signed)
OBSTETRIC ADMISSION HISTORY AND PHYSICAL  Donna Fernandez is a 21 y.o. female G2P1001 with IUP at [redacted]w[redacted]d by LMP presenting for contractions and SROM. She states that she had a gush of clear fluid around 9 AM this morning. Her contractions then started to get intense around 12 PM.  Her fern test was positive in the MAU. She reports +FMs, no VB, no blurry vision, headaches, peripheral edema, or RUQ pain.  She plans on formula feeding. She requests BTL for birth control for birth control postpartum (consent signed 09/26/21).   She received her prenatal care at [facility].   Dating: By LMP --->  Estimated Date of Delivery: 12/14/21  Sono:   '@[redacted]w[redacted]d'$ , CWD, normal anatomy, cephalic presentation, posterior placental lie, 1846 g, 33% EFW  Prenatal History/Complications:  Rubella non-immune   Past Medical History: Past Medical History:  Diagnosis Date   Anemia    Medical history non-contributory    Otitis media, recurrent     Past Surgical History: Past Surgical History:  Procedure Laterality Date   ADENOIDECTOMY     APPENDECTOMY     LAPAROSCOPIC APPENDECTOMY N/A 07/29/2013   Procedure: APPENDECTOMY LAPAROSCOPIC;  Surgeon: Donna Fernandez. Donna Stabs, MD;  Location: [place];  Service: Pediatrics;  Laterality: N/A;   TYMPANOSTOMY TUBE PLACEMENT      Obstetrical History: OB History     Gravida  2   Para  1   Term  1   Preterm      AB      Living  1      SAB      IAB      Ectopic      Multiple  0   Live Births  1           Social History Social History   Socioeconomic History   Marital status: Single    Spouse name: Not on file   Number of children: Not on file   Years of education: Not on file   Highest education level: Not on file  Occupational History   Not on file  Tobacco Use   Smoking status: Never   Smokeless tobacco: Never  Vaping Use   Vaping Use: Never used  Substance and Sexual Activity   Alcohol use: No    Alcohol/week: 0.0 standard drinks   Drug  use: No   Sexual activity: Not Currently    Birth control/protection: None  Other Topics Concern   Not on file  Social History Narrative   Lives w/ parents and 3 siblings. 1 dog outside.   Social Determinants of Health   Financial Resource Strain: Not on file  Food Insecurity: Not on file  Transportation Needs: Not on file  Physical Activity: Not on file  Stress: Not on file  Social Connections: Not on file    Family History: Family History  Problem Relation Age of Onset   Asthma Mother    Diabetes type II Maternal Uncle    Diabetes type II Maternal Aunt     Allergies: No Known Allergies  Medications Prior to Admission  Medication Sig Dispense Refill Last Dose   omeprazole (PRILOSEC) 40 MG capsule Take 1 capsule (40 mg total) by mouth daily. 30 capsule 0    Prenatal Vit-Fe Fumarate-FA (PRENATAL MULTIVITAMIN) TABS tablet Take 1 tablet by mouth daily at 12 noon.        Review of Systems  All systems reviewed and negative except as stated in HPI  Blood pressure 130/78, pulse 95,  temperature 98.3 F (36.8 C), temperature source Oral, resp. rate 17, last menstrual period 03/09/2021, SpO2 100 %, unknown if currently breastfeeding.  General appearance: alert, cooperative, and no distress Lungs: normal work of breathing on room air  Heart: normal rate, warm and well perfused  Abdomen: soft, non-tender, gravid  Extremities: no LE edema or calf tenderness to palpation  Presentation:  Cephalic per RN Fetal monitoring: Baseline 135 bpm, moderate variability, + accels, no decels  Uterine activity: Every 5-7 minutes  Dilation: 4 Effacement (%): 50 Station: -3 Exam by:: Donna Mcgregor, RN   Prenatal labs: ABO, Rh:  O POS Antibody:  NEG Rubella:  Non-immune RPR:  Non-reactive HBsAg:  Non-reactive  HIV:  Non-reactive  GBS:  Positive  1 hr Glucola normal  Genetic screening normal  Anatomy US normal   Prenatal Transfer Tool  Maternal Diabetes: No Genetic Screening:  Normal Maternal Ultrasounds/Referrals: Normal Fetal Ultrasounds or other Referrals:  None Maternal Substance Abuse:  No Significant Maternal Medications:  None Significant Maternal Lab Results: Group B Strep positive  No results found for this or any previous visit (from the past 24 hour(s)).  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 G2P1001 at [redacted]w[redacted]d here for SROM/SOL.   #Labor: Contracting regularly. Will continue expectant management and reassess in 3 hours. Plan to add Pitocin for augmentation as needed if contractions space or progress stalls.  #Pain: PRN; requesting nitrous oxide  #FWB: Cat 1  #ID:  GBS positive; PCN ordered  #MOF: Formula  #MOC: BTL (consent signed 09/26/21)  #Rubella non-immune: Plan for MMR postpartum.   Donna Del, MD  12/18/2021, 5:36 PM

## 2021-12-18 NOTE — MAU Note (Signed)
Pt reports to mau with c/o ctx q 3-5 min since 12 noon.  Pt reports she noticed one gush of clear fluid around 0900 this morning but has not had any leaking since.  Reports movement has slowed down.  Denies bleeding FHR 135

## 2021-12-18 NOTE — Lactation Note (Signed)
This note was copied from a baby's chart. Lactation Consultation Note Mom's choice of feeding if formula.  Patient Name: Donna Fernandez QQPYP'P Date: 12/18/2021   Age:22 hours  Maternal Data    Feeding    LATCH Score                    Lactation Tools Discussed/Used    Interventions    Discharge    Consult Status Consult Status: Complete    Charyl Dancer 12/18/2021, 11:47 PM

## 2021-12-18 NOTE — Progress Notes (Addendum)
Labor Progress Note Donna Fernandez Donna Fernandez is a 21 y.o. G2P1001 at [redacted]w[redacted]d presented for SOL/SROM.   S: Feeling stronger contractions more frequently.   O:  BP 118/76    Pulse 73    Temp 98.6 F (37 C) (Axillary)    Resp 18    LMP 03/09/2021    SpO2 100%  EFM: Baseline FHR 130 bpm/moderate variability/intermittent variables  CVE: Dilation: 5.5 Effacement (%): 90 Station: -1 Presentation: Vertex Exam by:: Dr. Robie Ridge S   A&P: 21 y.o. G2P1001 [redacted]w[redacted]d here for SOL/SROM.  #Labor: Progressing well. AROM of forebag with yellow, lightly meconium stained fluid. Intermittent variables following AROM, suspect progression of labor, improved with position change. Plan to continue with expectant management. Will reassess in 2 hours. Anticipate NSVD.  #Pain: PRN #FWB: Category 2 #GBS positive, PCN ordered   Karsten Fells, DO PGY-1 12/18/2021, 10:00 PM   Attestation of CNM Supervision of Resident: Evaluation and management procedures were performed by the [facility] Medicine Resident under my supervision. I was immediately available for direct supervision, assistance and direction throughout this encounter. I also confirm that I have verified the information documented in the residents note, and that I have also personally reperformed the pertinent components of the physical exam and all of the medical decision making activities.  I have also made any necessary editorial changes.   Renee Harder, CNM 12/18/2021 10:08 PM

## 2021-12-19 ENCOUNTER — Encounter (HOSPITAL_COMMUNITY): Payer: Self-pay | Admitting: Obstetrics and Gynecology

## 2021-12-19 DIAGNOSIS — O4202 Full-term premature rupture of membranes, onset of labor within 24 hours of rupture: Secondary | ICD-10-CM

## 2021-12-19 DIAGNOSIS — O480 Post-term pregnancy: Secondary | ICD-10-CM

## 2021-12-19 DIAGNOSIS — O99820 Streptococcus B carrier state complicating pregnancy: Secondary | ICD-10-CM

## 2021-12-19 DIAGNOSIS — Z3A40 40 weeks gestation of pregnancy: Secondary | ICD-10-CM

## 2021-12-19 DIAGNOSIS — Z2839 Other underimmunization status: Secondary | ICD-10-CM

## 2021-12-19 DIAGNOSIS — O09899 Supervision of other high risk pregnancies, unspecified trimester: Secondary | ICD-10-CM | POA: Insufficient documentation

## 2021-12-19 LAB — RPR: RPR Ser Ql: NONREACTIVE

## 2021-12-19 MED ORDER — SIMETHICONE 80 MG PO CHEW: 80.0000 mg | CHEWABLE_TABLET | ORAL | Status: DC | PRN

## 2021-12-19 MED ORDER — DIPHENHYDRAMINE HCL 25 MG PO CAPS: 25.0000 mg | ORAL_CAPSULE | Freq: Four times a day (QID) | ORAL | Status: DC | PRN

## 2021-12-19 MED ORDER — PRENATAL MULTIVITAMIN CH
1.0000 | ORAL_TABLET | Freq: Every day | ORAL | Status: DC
  Administered 2021-12-19: 1 via ORAL
  Filled 2021-12-19: qty 1

## 2021-12-19 MED ORDER — TETANUS-DIPHTH-ACELL PERTUSSIS 5-2.5-18.5 LF-MCG/0.5 IM SUSY: 0.5000 mL | PREFILLED_SYRINGE | Freq: Once | INTRAMUSCULAR | Status: DC

## 2021-12-19 MED ORDER — LACTATED RINGERS IV SOLN: INTRAVENOUS | Status: DC

## 2021-12-19 MED ORDER — WITCH HAZEL-GLYCERIN EX PADS: 1.0000 "application " | MEDICATED_PAD | CUTANEOUS | Status: DC | PRN

## 2021-12-19 MED ORDER — DIBUCAINE (PERIANAL) 1 % EX OINT: 1.0000 "application " | TOPICAL_OINTMENT | CUTANEOUS | Status: DC | PRN

## 2021-12-19 MED ORDER — SENNOSIDES-DOCUSATE SODIUM 8.6-50 MG PO TABS: 2.0000 | ORAL_TABLET | Freq: Every day | ORAL | Status: DC

## 2021-12-19 MED ORDER — ONDANSETRON HCL 4 MG PO TABS: 4.0000 mg | ORAL_TABLET | ORAL | Status: DC | PRN

## 2021-12-19 MED ORDER — IBUPROFEN 600 MG PO TABS
600.0000 mg | ORAL_TABLET | Freq: Four times a day (QID) | ORAL | Status: DC
  Administered 2021-12-19 – 2021-12-20 (×4): 600 mg via ORAL
  Filled 2021-12-19 (×5): qty 1

## 2021-12-19 MED ORDER — BENZOCAINE-MENTHOL 20-0.5 % EX AERO: 1.0000 "application " | INHALATION_SPRAY | CUTANEOUS | Status: DC | PRN

## 2021-12-19 MED ORDER — COCONUT OIL OIL: 1.0000 "application " | TOPICAL_OIL | Status: DC | PRN

## 2021-12-19 MED ORDER — ACETAMINOPHEN 325 MG PO TABS
650.0000 mg | ORAL_TABLET | ORAL | Status: DC | PRN
  Administered 2021-12-19 (×2): 650 mg via ORAL
  Filled 2021-12-19 (×2): qty 2

## 2021-12-19 MED ORDER — MEASLES, MUMPS & RUBELLA VAC IJ SOLR
0.5000 mL | Freq: Once | INTRAMUSCULAR | Status: AC
  Administered 2021-12-20: 0.5 mL via SUBCUTANEOUS
  Filled 2021-12-19: qty 0.5

## 2021-12-19 MED ORDER — FAMOTIDINE 20 MG PO TABS
40.0000 mg | ORAL_TABLET | Freq: Once | ORAL | Status: AC
  Administered 2021-12-20: 40 mg via ORAL
  Filled 2021-12-19: qty 2

## 2021-12-19 MED ORDER — ONDANSETRON HCL 4 MG/2ML IJ SOLN: 4.0000 mg | INTRAMUSCULAR | Status: DC | PRN

## 2021-12-19 MED ORDER — METOCLOPRAMIDE HCL 10 MG PO TABS
10.0000 mg | ORAL_TABLET | Freq: Once | ORAL | Status: AC
  Administered 2021-12-20: 10 mg via ORAL
  Filled 2021-12-19: qty 1

## 2021-12-19 NOTE — Progress Notes (Addendum)
Post Partum Day 1 Subjective: Donna Fernandez is a 21 y.o. female G2P2002 who presented at [redacted]w[redacted]d with contractions and SROM, PPD#1 SVD. Reports doing well, minimal pain. Reports mild vaginal bleeding. Ambulating, voiding without difficulty, and tolerating oral intake.  Objective: Blood pressure 116/70, pulse 85, temperature 98.5 F (36.9 C), temperature source Oral, resp. rate 16, last menstrual period 03/09/2021, SpO2 100 %, unknown if currently breastfeeding.  Physical Exam:  General: alert, cooperative, and no distress Lungs: Normal work of breathing on room air Heart: Normal rate, warm and well-perfused Uterine Fundus: firm Extremities: No LE edema, no calf tenderness to palpation  Recent Labs    12/18/21 1800  HGB 12.8  HCT 39.1    Assessment/Plan: Donna Fernandez [name] is a 21 y.o. [id number] s/p SVD at [redacted]w[redacted]d.  PPD#1 - Doing well, agrees to postpartum MMR vaccine. Contraception: BTL scheduled for 12/20/21 Feeding: Breast and bottle feeding. Dispo: Plan for discharge tomorrow.   LOS: 1 day   Donna Homans, DO PGY-1 12/19/2021, 6:34 AM    Attestation of CNM Supervision of Resident: Evaluation and management procedures were performed by the [facility] Medicine Resident under my supervision. I was immediately available for direct supervision, assistance and direction throughout this encounter.  I also confirm that I have verified the information documented in the residents note, and that I have also personally reperformed the pertinent components of the physical exam and all of the medical decision making activities.  I have also made any necessary editorial changes.  Renee Harder, CNM 12/19/2021 7:54 AM

## 2021-12-19 NOTE — Plan of Care (Signed)
Pt admitted to [facility] this shift. Pt stable after delivery. Oriented to unit and routines. Baby safety sheet reviewed. Discussed breastfeeding and if pt wishes to breastfeed. She states she desire to do so but works night shift and is worried she will not have time to pump. Encouraged pt that we are here to support her in whatever feeding choice she makes, but that if she does desire to breastfeed, she has legal rights to pumping breaks and we can assist her with getting a pump. Pt verbalizes understanding and states she will ask for assistance as needed. Pt steady on her feet, pain well-controlled with oral ibuprofen. Pt scheduled for Tubal ligation today at 1530. Pt aware and was previously consented.

## 2021-12-19 NOTE — Discharge Summary (Signed)
Postpartum Discharge Summary   Patient Name: Donna Fernandez DOB: [birth year] MRN: [id number]  Date of admission: 12/18/2021 Delivery date:12/18/2021  Delivering provider: Vennie Homans  Date of discharge: 12/20/2021  Admitting diagnosis: Normal labor [O80, Z37.9] Intrauterine pregnancy: [redacted]w[redacted]d     Secondary diagnosis:  Principal Problem:   SVD (spontaneous vaginal delivery) Active Problems:   Rubella non-immune status, antepartum   Unwanted fertility  Additional problems: None    Discharge diagnosis: Term Pregnancy Delivered                                              Post partum procedures: BTL prior to discharge Augmentation: AROM Complications: None  Hospital course: Onset of Labor With Vaginal Delivery      21 y.o. yo [id number] at [redacted]w[redacted]d was admitted in Latent Labor on 12/18/2021. Patient had an uncomplicated labor course as follows:  Membrane Rupture Time/Date: 9:00 AM ,12/18/2021   Delivery Method:Vaginal, Spontaneous  Episiotomy: None  Lacerations:  1st degree  Patient had an uncomplicated postpartum course.  She is ambulating, tolerating a regular diet, passing flatus, and urinating well.  Patient is discharged home in stable condition on 12/20/21.  Newborn Data: Birth date:12/18/2021  Birth time:11:23 PM  Gender:Female  Living status:Living  Apgars:8 ,9  Weight:3800 g   Magnesium Sulfate received: No BMZ received: No Rhophylac: N/A MMR: Offered postpartum  T-DaP: Given prenatally Flu: Given prenatally  Transfusion: No  Physical exam  Vitals:   12/19/21 0957 12/19/21 1458 12/19/21 2159 12/20/21 0535  BP: 122/78 113/70 106/63 106/66  Pulse: 82 82 72 71  Resp: $Remo'16 18 18 16  'ziWnl$ Temp: 97.7 F (36.5 C) 98.5 F (36.9 C) 98 F (36.7 C) 98.5 F (36.9 C)  TempSrc: Oral Oral Axillary Oral  SpO2: 99% 100%  100%   General: alert, cooperative, and no distress Lochia: appropriate Uterine Fundus: firm and below umbilicus  DVT Evaluation: no LE edema or  calf tenderness to palpation   Labs: Lab Results  Component Value Date   WBC 9.5 12/18/2021   HGB 12.8 12/18/2021   HCT 39.1 12/18/2021   MCV 88.5 12/18/2021   PLT 286 12/18/2021   CMP Latest Ref Rng & Units 03/06/2021  Glucose 70 - 99 mg/dL 93  BUN 6 - 20 mg/dL 6  Creatinine 0.44 - 1.00 mg/dL 0.64  Sodium 135 - 145 mmol/L 138  Potassium 3.5 - 5.1 mmol/L 3.8  Chloride 98 - 111 mmol/L 104  CO2 22 - 32 mmol/L 27  Calcium 8.9 - 10.3 mg/dL 9.1  Total Protein 6.5 - 8.1 g/dL 7.1  Total Bilirubin 0.3 - 1.2 mg/dL 1.1  Alkaline Phos 38 - 126 U/L 52  AST 15 - 41 U/L 17  ALT 0 - 44 U/L 16   Edinburgh Score: Edinburgh Postnatal Depression Scale Screening Tool 12/19/2021  I have been able to laugh and see the funny side of things. 0  I have looked forward with enjoyment to things. 0  I have blamed myself unnecessarily when things went wrong. 0  I have been anxious or worried for no good reason. 0  I have felt scared or panicky for no good reason. 0  Things have been getting on top of me. 0  I have been so unhappy that I have had difficulty sleeping. 0  I have felt sad or miserable. 0  I have been so unhappy that I have been crying. 0  The thought of harming myself has occurred to me. 0  Edinburgh Postnatal Depression Scale Total 0     After visit meds:  Allergies as of 12/20/2021   No Known Allergies      Medication List     STOP taking these medications    omeprazole 40 MG capsule Commonly known as: PRILOSEC       TAKE these medications    acetaminophen 500 MG tablet Commonly known as: TYLENOL Take 2 tablets (1,000 mg total) by mouth every 8 (eight) hours as needed (pain).   ibuprofen 600 MG tablet Commonly known as: ADVIL Take 1 tablet (600 mg total) by mouth every 6 (six) hours as needed (pain).   oxyCODONE 5 MG immediate release tablet Commonly known as: Roxicodone Take 1 tablet (5 mg total) by mouth every 6 (six) hours as needed for severe pain or  breakthrough pain.   prenatal multivitamin Tabs tablet Take 1 tablet by mouth daily at 12 noon.        Discharge home in stable condition Infant Feeding: Bottle Infant Disposition: home with mother Discharge instruction: per After Visit Summary and Postpartum booklet. Activity: Advance as tolerated. Pelvic rest for 6 weeks.  Diet: routine diet  Follow up Visit: Patient to call [facility] to schedule postpartum follow up. Low risk pregnancy complicated by:  None Delivery mode:  Vaginal, Spontaneous  Anticipated Birth Control:  BTL done [facility]  12/20/2021 Genia Del, MD

## 2021-12-20 ENCOUNTER — Inpatient Hospital Stay (HOSPITAL_COMMUNITY): Payer: Medicaid Other | Admitting: Anesthesiology

## 2021-12-20 ENCOUNTER — Encounter (HOSPITAL_COMMUNITY)
Admission: AD | Disposition: A | Discharge status: Home / Self Care | Payer: Self-pay | Source: Home / Self Care | Attending: Obstetrics and Gynecology

## 2021-12-20 DIAGNOSIS — Z3009 Encounter for other general counseling and advice on contraception: Secondary | ICD-10-CM | POA: Diagnosis present

## 2021-12-20 DIAGNOSIS — Z302 Encounter for sterilization: Secondary | ICD-10-CM

## 2021-12-20 HISTORY — PX: TUBAL LIGATION: SHX77

## 2021-12-20 SURGERY — LIGATION, FALLOPIAN TUBE, POSTPARTUM
Anesthesia: General

## 2021-12-20 MED ORDER — DEXAMETHASONE SODIUM PHOSPHATE 10 MG/ML IJ SOLN
INTRAMUSCULAR | Status: AC
  Filled 2021-12-20: qty 1

## 2021-12-20 MED ORDER — ACETAMINOPHEN 10 MG/ML IV SOLN: 1000.0000 mg | Freq: Once | INTRAVENOUS | Status: DC | PRN

## 2021-12-20 MED ORDER — SUCCINYLCHOLINE CHLORIDE 200 MG/10ML IV SOSY
PREFILLED_SYRINGE | INTRAVENOUS | Status: AC
  Filled 2021-12-20: qty 10

## 2021-12-20 MED ORDER — MIDAZOLAM HCL 2 MG/2ML IJ SOLN
INTRAMUSCULAR | Status: AC
  Filled 2021-12-20: qty 2

## 2021-12-20 MED ORDER — ONDANSETRON HCL 4 MG/2ML IJ SOLN
INTRAMUSCULAR | Status: DC | PRN
  Administered 2021-12-20: 4 mg via INTRAVENOUS

## 2021-12-20 MED ORDER — DEXMEDETOMIDINE (PRECEDEX) IN NS 20 MCG/5ML (4 MCG/ML) IV SYRINGE
PREFILLED_SYRINGE | INTRAVENOUS | Status: AC
  Filled 2021-12-20: qty 5

## 2021-12-20 MED ORDER — OXYCODONE HCL 5 MG PO TABS: 5.0000 mg | ORAL_TABLET | Freq: Four times a day (QID) | ORAL | 0 refills | Status: DC | PRN

## 2021-12-20 MED ORDER — ACETAMINOPHEN 500 MG PO TABS: 1000.0000 mg | ORAL_TABLET | Freq: Three times a day (TID) | ORAL | 0 refills | Status: DC | PRN

## 2021-12-20 MED ORDER — FENTANYL CITRATE (PF) 100 MCG/2ML IJ SOLN
25.0000 ug | INTRAMUSCULAR | Status: DC | PRN
  Administered 2021-12-20: 25 ug via INTRAVENOUS

## 2021-12-20 MED ORDER — KETOROLAC TROMETHAMINE 30 MG/ML IJ SOLN: 30.0000 mg | Freq: Once | INTRAMUSCULAR | Status: DC | PRN

## 2021-12-20 MED ORDER — FENTANYL CITRATE (PF) 100 MCG/2ML IJ SOLN
INTRAMUSCULAR | Status: AC
  Filled 2021-12-20: qty 2

## 2021-12-20 MED ORDER — SUCCINYLCHOLINE CHLORIDE 200 MG/10ML IV SOSY
PREFILLED_SYRINGE | INTRAVENOUS | Status: DC | PRN
  Administered 2021-12-20: 140 mg via INTRAVENOUS

## 2021-12-20 MED ORDER — IBUPROFEN 600 MG PO TABS: 600.0000 mg | ORAL_TABLET | Freq: Four times a day (QID) | ORAL | 0 refills | Status: DC | PRN

## 2021-12-20 MED ORDER — FENTANYL CITRATE (PF) 250 MCG/5ML IJ SOLN
INTRAMUSCULAR | Status: AC
  Filled 2021-12-20: qty 5

## 2021-12-20 MED ORDER — ROCURONIUM BROMIDE 10 MG/ML (PF) SYRINGE
PREFILLED_SYRINGE | INTRAVENOUS | Status: DC | PRN
  Administered 2021-12-20 (×2): 10 mg via INTRAVENOUS

## 2021-12-20 MED ORDER — PROPOFOL 10 MG/ML IV BOLUS
INTRAVENOUS | Status: DC | PRN
  Administered 2021-12-20: 170 mg via INTRAVENOUS

## 2021-12-20 MED ORDER — BUPIVACAINE HCL (PF) 0.25 % IJ SOLN
INTRAMUSCULAR | Status: DC | PRN
Start: 2021-12-20 — End: 2021-12-20
  Administered 2021-12-20: 30 mL

## 2021-12-20 MED ORDER — LIDOCAINE 2% (20 MG/ML) 5 ML SYRINGE
INTRAMUSCULAR | Status: AC
  Filled 2021-12-20: qty 5

## 2021-12-20 MED ORDER — DEXAMETHASONE SODIUM PHOSPHATE 10 MG/ML IJ SOLN
INTRAMUSCULAR | Status: DC | PRN
  Administered 2021-12-20: 10 mg via INTRAVENOUS

## 2021-12-20 MED ORDER — KETOROLAC TROMETHAMINE 30 MG/ML IJ SOLN
INTRAMUSCULAR | Status: DC | PRN
  Administered 2021-12-20: 30 mg via INTRAVENOUS

## 2021-12-20 MED ORDER — ACETAMINOPHEN 10 MG/ML IV SOLN
INTRAVENOUS | Status: DC | PRN
  Administered 2021-12-20: 1000 mg via INTRAVENOUS

## 2021-12-20 MED ORDER — LIDOCAINE 2% (20 MG/ML) 5 ML SYRINGE
INTRAMUSCULAR | Status: DC | PRN
  Administered 2021-12-20: 20 mg via INTRAVENOUS

## 2021-12-20 MED ORDER — FENTANYL CITRATE (PF) 100 MCG/2ML IJ SOLN
INTRAMUSCULAR | Status: DC | PRN
  Administered 2021-12-20: 25 ug via INTRAVENOUS
  Administered 2021-12-20: 100 ug via INTRAVENOUS
  Administered 2021-12-20: 50 ug via INTRAVENOUS

## 2021-12-20 MED ORDER — ACETAMINOPHEN 10 MG/ML IV SOLN
INTRAVENOUS | Status: AC
  Filled 2021-12-20: qty 100

## 2021-12-20 MED ORDER — KETOROLAC TROMETHAMINE 30 MG/ML IJ SOLN
INTRAMUSCULAR | Status: AC
  Filled 2021-12-20: qty 1

## 2021-12-20 MED ORDER — BUPIVACAINE HCL (PF) 0.25 % IJ SOLN
INTRAMUSCULAR | Status: AC
  Filled 2021-12-20: qty 30

## 2021-12-20 MED ORDER — MIDAZOLAM HCL 2 MG/2ML IJ SOLN
INTRAMUSCULAR | Status: DC | PRN
  Administered 2021-12-20: 2 mg via INTRAVENOUS

## 2021-12-20 MED ORDER — SUGAMMADEX SODIUM 200 MG/2ML IV SOLN
INTRAVENOUS | Status: DC | PRN
  Administered 2021-12-20: 200 mg via INTRAVENOUS

## 2021-12-20 MED ORDER — ONDANSETRON HCL 4 MG/2ML IJ SOLN
INTRAMUSCULAR | Status: AC
  Filled 2021-12-20: qty 2

## 2021-12-20 SURGICAL SUPPLY — 26 items
DERMABOND ADVANCED (GAUZE/BANDAGES/DRESSINGS) ×1
DERMABOND ADVANCED .7 DNX12 (GAUZE/BANDAGES/DRESSINGS) IMPLANT
DRSG OPSITE POSTOP 3X4 (GAUZE/BANDAGES/DRESSINGS) ×2 IMPLANT
DURAPREP 26ML APPLICATOR (WOUND CARE) ×2 IMPLANT
ELECT REM PT RETURN 9FT ADLT (ELECTROSURGICAL) ×2
ELECTRODE REM PT RTRN 9FT ADLT (ELECTROSURGICAL) IMPLANT
GLOVE SURG POLYISO LF SZ7 (GLOVE) ×2 IMPLANT
GLOVE SURG UNDER POLY LF SZ7 (GLOVE) ×4 IMPLANT
GOWN STRL REUS W/TWL LRG LVL3 (GOWN DISPOSABLE) ×2 IMPLANT
LIGASURE IMPACT 36 18CM CVD LR (INSTRUMENTS) ×1 IMPLANT
NEEDLE HYPO 22GX1.5 SAFETY (NEEDLE) ×2 IMPLANT
NS IRRIG 1000ML POUR BTL (IV SOLUTION) ×2 IMPLANT
PACK ABDOMINAL MINOR (CUSTOM PROCEDURE TRAY) ×2 IMPLANT
PENCIL SMOKE EVACUATOR (MISCELLANEOUS) ×1 IMPLANT
PROTECTOR NERVE ULNAR (MISCELLANEOUS) ×2 IMPLANT
SPONGE GAUZE 2X2 8PLY STRL LF (GAUZE/BANDAGES/DRESSINGS) ×2 IMPLANT
SPONGE LAP 4X18 RFD (DISPOSABLE) ×2 IMPLANT
SUT MON AB 2-0 CT1 36 (SUTURE) ×2 IMPLANT
SUT PLAIN 0 NONE (SUTURE) IMPLANT
SUT VIC AB 0 CT1 27 (SUTURE) ×1
SUT VIC AB 0 CT1 27XBRD ANBCTR (SUTURE) ×1 IMPLANT
SUT VIC AB 3-0 PS2 18 (SUTURE) ×2 IMPLANT
SUT VICRYL 0 UR6 27IN ABS (SUTURE) ×2 IMPLANT
SYR CONTROL 10ML LL (SYRINGE) ×2 IMPLANT
TOWEL OR 17X24 6PK STRL BLUE (TOWEL DISPOSABLE) ×2 IMPLANT
WATER STERILE IRR 1000ML POUR (IV SOLUTION) ×2 IMPLANT

## 2021-12-20 NOTE — Progress Notes (Signed)
Patient ID: Donna Fernandez, female   DOB: May 29, 2001, 21 y.o.   MRN: [id number]  Risks of procedure discussed with patient including but not limited to: risk of regret, permanence of method, bleeding, infection, injury to surrounding organs and need for additional procedures.  Failure risk of 1 -2 % with increased risk of ectopic gestation if pregnancy occurs was also discussed with patient.    Levie Heritage, DO 12/20/2021 2:13 PM

## 2021-12-20 NOTE — Progress Notes (Signed)
Patient returned from BTL, ambulated to the bathroom without difficulty and able to completely empty bladder, tolerating fluids, and meal tray at bedside.  Post-op care reviewed with patient prior to D/C, acknowledged instructions.

## 2021-12-20 NOTE — Op Note (Signed)
Operative Note   Donna Fernandez   SURGERY DATE: 12/20/2021  PRE-OP DIAGNOSIS: Multiparity, undesired Fertility  POST-OP DIAGNOSIS: Same   PROCEDURE: Postpartum bilateral tubal ligation using salpingectomy  SURGEON: Surgeon(s) and Role:    * Levie Heritage, DO - Primary    * Yahir Tavano, Hiram Comber, DO - Assisting  ASSISTANT: Jen Mow, DO   ANESTHESIA: general  COMPLICATIONS:  None immediate.  ESTIMATED BLOOD LOSS:  Less than 10 ml.  FLUIDS: 700 ml LR.  URINE OUTPUT:  100 ml of clear urine.  INDICATIONS: 21 y.o. [id number]  with undesired fertility,status post vaginal delivery, desires permanent sterilization. Risks and benefits of procedure discussed with patient including permanence of method, bleeding, infection, injury to surrounding organs and need for additional procedures. Risk failure of 0.5-1% with increased risk of ectopic gestation if pregnancy occurs was also discussed with patient.   FINDINGS:  Normal uterus, tubes, and ovaries.  TECHNIQUE:  The patient was taken to the operating room where her epidural anesthesia was dosed up to surgical level and found to be adequate.  She was then placed in the dorsal supine position and prepped and draped in sterile fashion.  After an adequate timeout was performed, attention was turned to the patient's abdomen where a small transverse skin incision was made under the umbilical fold. The incision was taken down to the layer of fascia using the scalpel, and fascia was incised, and extended bilaterally using Mayo scissors. The peritoneum was entered in a sharp fashion. Attention was then turned to the patient's uterus.  The left fallopian tube was identified down to the fimbriae. A LigaSure device was used via manufacturers instruction to ligate the fallopian tube from the the underlying mesosalpinx from the fimbria to approximately 2-3cm from the cornual attachment.  A similar process was carried out on the right side  allowing for bilateral salpingectomy.  Good hemostasis was noted overall. The instruments were then removed from the patient's abdomen and the fascial incision was repaired with 0 Vicryl, and the skin was closed with a 4-0 Monocryl subcuticular stitch. 30cc 1% Marcaine injected subcutaneously at and around incision site. The patient tolerated the procedure well.  Sponge, lap, and needle counts were correct times two.  The patient was then taken to the recovery room awake, extubated and in stable condition.  The patient tolerated the procedure well. Sponge, lap, needle, and instrument counts were correct x 2. The patient was transferred to the recovery room awake, alert and breathing independently in stable condition.  Jen Mow, DO [facility] for [employer], [facility] 12/20/2021  4:31 PM

## 2021-12-20 NOTE — Lactation Note (Signed)
This note was copied from a baby's chart. Lactation Consultation Note  Patient Name: Donna Fernandez [id number]'[id number] Date: 12/20/2021 Reason for consult: Follow-up assessment;Term;Infant weight loss;Other (Comment);Nipple pain/trauma (5 % weight loss, left sore nipple  positional strip - intact. LC reviewed breastfeeding basics for prep prior to latch due to positional strip and prevention. see below.) Age:22 hours LC recommended prior to latching on the left breast - breast massage, hand express, prepump if needed and reverse pressure.  Mom aware of BF D/C teaching if she gets D/C later today.  Mom aware of the [facility] resources after D/C.   Maternal Data Has patient been taught Hand Expression?: Yes (several drops - LC showed mom how to hand express - areola more complex) Does the patient have breastfeeding experience prior to this delivery?: Yes How long did the patient breastfeed?: 3 years  Feeding Mother's Current Feeding Choice: Breast Milk and Formula Nipple Type: Slow - flow  LATCH Score                    Lactation Tools Discussed/Used Tools: Pump;Flanges Flange Size: 24;27;Other (comment) (# 22 F for when the milk comes in) Breast pump type: Manual Pump Education: Milk Storage;Setup, frequency, and cleaning  Interventions Interventions: Breast feeding basics reviewed;Education  Discharge Discharge Education: Engorgement and breast care;Warning signs for feeding baby Pump: Manual  Consult Status Consult Status: Complete Date: 12/20/21    Kathrin Greathouse 12/20/2021, 9:09 AM

## 2021-12-20 NOTE — Anesthesia Procedure Notes (Signed)
Procedure Name: Intubation Date/Time: 12/20/2021 3:23 PM Performed by: Asher Muir, CRNA Pre-anesthesia Checklist: Patient identified, Emergency Drugs available, Suction available and Patient being monitored Patient Re-evaluated:Patient Re-evaluated prior to induction Oxygen Delivery Method: Circle system utilized Preoxygenation: Pre-oxygenation with 100% oxygen Induction Type: IV induction and Cricoid Pressure applied Ventilation: Mask ventilation without difficulty Laryngoscope Size: Mac, 3 and Glidescope Grade View: Grade II Tube size: 7.0 mm Number of attempts: 1 Airway Equipment and Method: Video-laryngoscopy Placement Confirmation: ETT inserted through vocal cords under direct vision, positive ETCO2 and breath sounds checked- equal and bilateral Secured at: 20 cm Tube secured with: Tape Dental Injury: Teeth and Oropharynx as per pre-operative assessment

## 2021-12-20 NOTE — Transfer of Care (Signed)
Immediate Anesthesia Transfer of Care Note  Patient: Donna Fernandez  Procedure(s) Performed: POST PARTUM TUBAL LIGATION  Patient Location: PACU  Anesthesia Type:General  Level of Consciousness: drowsy  Airway & Oxygen Therapy: Patient Spontanous Breathing  Post-op Assessment: Report given to RN and Post -op Vital signs reviewed and stable  Post vital signs: Reviewed and stable  Last Vitals:  Vitals Value Taken Time  BP 131/85 12/20/21 1624  Temp    Pulse 79 12/20/21 1626  Resp 18 12/20/21 1626  SpO2 100 % 12/20/21 1626  Vitals shown include unvalidated device data.  Last Pain:  Vitals:   12/20/21 1451  TempSrc: Oral  PainSc:       Patients Stated Pain Goal: 0 ([id number])  Complications: No notable events documented.

## 2021-12-20 NOTE — Anesthesia Preprocedure Evaluation (Signed)
Anesthesia Evaluation  Patient identified by MRN, date of birth, ID band Patient awake    Reviewed: Allergy & Precautions, NPO status , Patient's Chart, lab work & pertinent test results  Airway Mallampati: II  TM Distance: >3 FB Neck ROM: Full    Dental no notable dental hx. (+) Teeth Intact, Dental Advisory Given   Pulmonary neg pulmonary ROS,    Pulmonary exam normal breath sounds clear to auscultation       Cardiovascular negative cardio ROS Normal cardiovascular exam Rhythm:Regular Rate:Normal     Neuro/Psych negative neurological ROS  negative psych ROS   GI/Hepatic negative GI ROS, Neg liver ROS,   Endo/Other  negative endocrine ROS  Renal/GU negative Renal ROS  negative genitourinary   Musculoskeletal negative musculoskeletal ROS (+)   Abdominal   Peds  Hematology negative hematology ROS (+)   Anesthesia Other Findings PPTL, pt did not have epidural for delivery  Reproductive/Obstetrics                             Anesthesia Physical Anesthesia Plan  ASA: 2  Anesthesia Plan: General   Post-op Pain Management: Toradol IV (intra-op)* and Ofirmev IV (intra-op)*   Induction: Intravenous  PONV Risk Score and Plan: 3 and Midazolam, Dexamethasone and Ondansetron  Airway Management Planned: Oral ETT  Additional Equipment:   Intra-op Plan:   Post-operative Plan: Extubation in OR  Informed Consent: I have reviewed the patients History and Physical, chart, labs and discussed the procedure including the risks, benefits and alternatives for the proposed anesthesia with the patient or authorized representative who has indicated his/her understanding and acceptance.     Dental advisory given  Plan Discussed with: CRNA  Anesthesia Plan Comments: (After discussing risks/benefits of general vs spinal anesthesia, pt would like to proceed with general anesthesia. )         Anesthesia Quick Evaluation

## 2021-12-21 ENCOUNTER — Inpatient Hospital Stay (HOSPITAL_COMMUNITY): Payer: Medicaid Other

## 2021-12-21 ENCOUNTER — Encounter (HOSPITAL_COMMUNITY): Payer: Self-pay | Admitting: Family Medicine

## 2021-12-21 NOTE — Anesthesia Postprocedure Evaluation (Signed)
Anesthesia Post Note  Patient: Donna Fernandez  Procedure(s) Performed: POST PARTUM TUBAL LIGATION     Patient location during evaluation: PACU Anesthesia Type: General Level of consciousness: awake and alert Pain management: pain level controlled Vital Signs Assessment: post-procedure vital signs reviewed and stable Respiratory status: spontaneous breathing, nonlabored ventilation, respiratory function stable and patient connected to nasal cannula oxygen Cardiovascular status: blood pressure returned to baseline and stable Postop Assessment: no apparent nausea or vomiting Anesthetic complications: no   No notable events documented.  Last Vitals:  Vitals:   12/20/21 1731 12/20/21 1738  BP: 116/78 117/77  Pulse:  81  Resp:  18  Temp:  36.9 C  SpO2:  98%    Last Pain:  Vitals:   12/20/21 1738  TempSrc: Oral  PainSc: 4                  Donna Fernandez

## 2021-12-22 LAB — SURGICAL PATHOLOGY

## 2021-12-28 ENCOUNTER — Telehealth (HOSPITAL_COMMUNITY): Payer: Self-pay | Admitting: *Deleted

## 2021-12-28 NOTE — Telephone Encounter (Signed)
Mom reports feeling good. No concerns about herself at this time. EPDS=0)Hospital score=0) ?Mom reports baby is doing well. Feeding, peeing, and pooping without difficulty. Safe sleep reviewed. Mom reports no concerns about baby at present. ? ?Duffy Rhody, RN 12-28-2021 at 1:20pm ?

## 2022-12-02 ENCOUNTER — Ambulatory Visit (HOSPITAL_COMMUNITY)
Admission: RE | Admit: 2022-12-02 | Discharge: 2022-12-02 | Disposition: A | Discharge status: Home / Self Care | Payer: Medicaid Other | Source: Ambulatory Visit | Attending: Physician Assistant | Admitting: Physician Assistant

## 2022-12-02 ENCOUNTER — Ambulatory Visit (INDEPENDENT_AMBULATORY_CARE_PROVIDER_SITE_OTHER): Payer: Medicaid Other

## 2022-12-02 ENCOUNTER — Encounter (HOSPITAL_COMMUNITY): Payer: Self-pay

## 2022-12-02 ENCOUNTER — Other Ambulatory Visit: Payer: Self-pay

## 2022-12-02 VITALS — BP 117/83 | HR 84 | Temp 99.4°F | Resp 18

## 2022-12-02 DIAGNOSIS — R059 Cough, unspecified: Secondary | ICD-10-CM | POA: Diagnosis not present

## 2022-12-02 DIAGNOSIS — J069 Acute upper respiratory infection, unspecified: Secondary | ICD-10-CM

## 2022-12-02 DIAGNOSIS — R051 Acute cough: Secondary | ICD-10-CM

## 2022-12-02 DIAGNOSIS — J111 Influenza due to unidentified influenza virus with other respiratory manifestations: Secondary | ICD-10-CM

## 2022-12-02 DIAGNOSIS — R0989 Other specified symptoms and signs involving the circulatory and respiratory systems: Secondary | ICD-10-CM | POA: Diagnosis not present

## 2022-12-02 DIAGNOSIS — R112 Nausea with vomiting, unspecified: Secondary | ICD-10-CM

## 2022-12-02 MED ORDER — OSELTAMIVIR PHOSPHATE 75 MG PO CAPS: 75.0000 mg | ORAL_CAPSULE | Freq: Two times a day (BID) | ORAL | 0 refills | Status: DC

## 2022-12-02 MED ORDER — PROMETHAZINE-DM 6.25-15 MG/5ML PO SYRP: 5.0000 mL | ORAL_SOLUTION | Freq: Four times a day (QID) | ORAL | 0 refills | Status: DC | PRN

## 2022-12-02 NOTE — ED Provider Notes (Signed)
[facility]    CSN: [id number] Arrival date & time: 12/02/22  1033      History   Chief Complaint Chief Complaint  Patient presents with   Cough    I been having severe coughing with mucous, I haven't been able to eat for the past 3 days only intake fluids. - Entered by patient   Back Pain    HPI Donna Fernandez is a 22 y.o. female.   22 year old female presents with fever, cough, vomiting, chest congestion.  Patient indicates for the past 5 days she has been having some upper respiratory congestion with rhinitis and postnasal drip.  She is also had bilateral ear congestion.  She relates she has been having cough which has been recurrent, having coughing spasms that makes her throw up repeatedly due to coughing multiple times.  She does have muscle pain from coughing so much particularly back discomfort and intermittent back pain from the coughing episodes.  She is not having wheezing or shortness of breath.  She has having fever, chills, body aches and pain.  She is tolerating fluids well.  She relates that both her daughters have flu a, flu B, and one of her daughters has pneumonia.  She is tolerating fluids well although she is fatigued.   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 Gerald Stabs, MD;  Location: [place];  Service: Pediatrics;  Laterality: N/A;   TUBAL LIGATION N/A 12/20/2021   Procedure: POST PARTUM TUBAL LIGATION;  Surgeon: Truett Mainland, DO;  Location: [place] [facility];  Service: Obstetrics;  Laterality: N/A;   TYMPANOSTOMY TUBE PLACEMENT      OB History     Gravida  2   Para  2   Term  2   Preterm      AB      Living  2      SAB      IAB      Ectopic      Multiple  0   Live Births  2            Home Medications    Prior to Admission medications   Medication Sig Start Date End Date Taking? Authorizing Provider  oseltamivir (TAMIFLU) 75 MG capsule Take 1 capsule (75 mg total) by mouth every 12 (twelve) hours. 12/02/22  Yes Nyoka Lint, PA-C  promethazine-dextromethorphan (PROMETHAZINE-DM) 6.25-15 MG/5ML syrup Take 5 mLs by mouth 4 (four) times daily as needed for cough. 12/02/22  Yes Nyoka Lint, PA-C  acetaminophen (TYLENOL) 500 MG tablet Take 2 tablets (1,000 mg total) by mouth every 8 (eight) hours as needed (pain). 12/20/21   Genia Del, MD  ibuprofen (ADVIL) 600 MG tablet Take 1  tablet (600 mg total) by mouth every 6 (six) hours as needed (pain). 12/20/21   Genia Del, MD  oxyCODONE (ROXICODONE) 5 MG immediate release tablet Take 1 tablet (5 mg total) by mouth every 6 (six) hours as needed for severe pain or breakthrough pain. 12/20/21   Gerrit Heck, MD  Prenatal Vit-Fe Fumarate-FA (PRENATAL MULTIVITAMIN) TABS tablet Take 1 tablet by mouth daily at 12 noon.    [provider]  Norethindrone Acetate-Ethinyl Estradiol (JUNEL 1.5/30) 1.5-30 MG-MCG tablet Take 1 tablet by mouth daily. 05/20/20 02/15/21  Parthenia Ames, NP    Family History Family History  Problem Relation Age of Onset   Asthma Mother    Diabetes type II Maternal Uncle    Diabetes type II Maternal Aunt     Social History Social History   Tobacco Use   Smoking status: Never    Smokeless tobacco: Never  Vaping Use   Vaping Use: Never used  Substance Use Topics   Alcohol use: No    Alcohol/week: 0.0 standard drinks of alcohol   Drug use: No     Allergies   Patient has no known allergies.   Review of Systems Review of Systems  Constitutional:  Positive for fatigue and fever.  Respiratory:  Positive for cough.   Musculoskeletal:  Positive for back pain.     Physical Exam Triage Vital Signs ED Triage Vitals  Enc Vitals Group     BP 12/02/22 1129 117/83     Pulse Rate 12/02/22 1129 84     Resp 12/02/22 1129 18     Temp 12/02/22 1129 99.4 F (37.4 C)     Temp src --      SpO2 12/02/22 1129 96 %     Weight --      Height --      Head Circumference --      Peak Flow --      Pain Score 12/02/22 1126 7     Pain Loc --      Pain Edu? --      Excl. in [place]? --    No data found.  Updated Vital Signs BP 117/83   Pulse 84   Temp 99.4 F (37.4 C)   Resp 18   LMP  (LMP Unknown)   SpO2 96%   Visual Acuity Right Eye Distance:   Left Eye Distance:   Bilateral Distance:    Right Eye Near:   Left Eye Near:    Bilateral Near:     Physical Exam Constitutional:      Appearance: Normal appearance.  HENT:     Right Ear: Ear canal normal. Tympanic membrane is injected.     Left Ear: Ear canal normal. Tympanic membrane is injected.     Mouth/Throat:     Mouth: Mucous membranes are moist.     Pharynx: Oropharynx is clear.  Cardiovascular:     Rate and Rhythm: Normal rate and regular rhythm.     Heart sounds: Normal heart sounds.  Pulmonary:     Effort: Pulmonary effort is normal.     Breath sounds: Normal breath sounds and air entry. No wheezing, rhonchi or rales.  Abdominal:     General: Abdomen is flat. Bowel sounds are normal.     Palpations: Abdomen is soft.     Tenderness: There is abdominal tenderness (mild). There is no guarding or rebound.  Lymphadenopathy:     Cervical: No cervical adenopathy.  Neurological:  Mental Status:  She is alert.      UC Treatments / Results  Labs (all labs ordered are listed, but only abnormal results are displayed) Labs Reviewed - No data to display  EKG   Radiology DG Chest 2 View  Result Date: 12/02/2022 CLINICAL DATA:  Cough and chest congestion EXAM: CHEST - 2 VIEW COMPARISON:  None Available. FINDINGS: Midline trachea.  Normal heart size and mediastinal contours. Sharp costophrenic angles.  No pneumothorax.  Clear lungs. IMPRESSION: No active cardiopulmonary disease. Electronically Signed   By: Abigail Miyamoto M.D.   On: 12/02/2022 12:00    Procedures Procedures (including critical care time)  Medications Ordered in UC Medications - No data to display  Initial Impression / Assessment and Plan / UC Course  I have reviewed the triage vital signs and the nursing notes.  Pertinent labs & imaging results that were available during my care of the patient were reviewed by me and considered in my medical decision making (see chart for details).    Plan: The diagnosis will be treated with the following: 1.  Upper respiratory tract infection: A.  Phenergan DM, every 6 hours to help control cough and congestion. 2.  Nausea and vomiting: A.  Phenergan DM, every 6 hours to help control nausea and vomiting. 3.  Flu: A.  Tamiflu 75 mg every 12 hours to treat the flu and for prevention. B.  Advised take ibuprofen or Tylenol as needed for fever body aches. 4.  Acute cough: A.  Phenergan DM, every 6 hours to help control nausea and vomiting. 5.  Advised follow-up PCP or return to urgent care if symptoms fail to improve. Final Clinical Impressions(s) / UC Diagnoses   Final diagnoses:  Viral upper respiratory tract infection  Flu  Nausea and vomiting, unspecified vomiting type  Acute cough     Discharge Instructions      Advised take Tamiflu 75 mg every 12 hours to treat the flu but also as a preventive measure since you have flu a and flu B in the household. Advised take  Phenergan DM, 1 teaspoon every 6 hours on a regular basis to help control cough and congestion.  (This medicine may make you drowsy so use with caution) Advised take ibuprofen or Tylenol as needed for body aches, pain and fever.  Advised follow-up PCP or return to urgent care as needed.     ED Prescriptions     Medication Sig Dispense Auth. Provider   promethazine-dextromethorphan (PROMETHAZINE-DM) 6.25-15 MG/5ML syrup Take 5 mLs by mouth 4 (four) times daily as needed for cough. 118 mL Nyoka Lint, PA-C   oseltamivir (TAMIFLU) 75 MG capsule Take 1 capsule (75 mg total) by mouth every 12 (twelve) hours. 10 capsule Nyoka Lint, PA-C      PDMP not reviewed this encounter.   Nyoka Lint, PA-C 12/02/22 1205

## 2022-12-02 NOTE — ED Triage Notes (Signed)
Pt reports a productive cough with green mucous.Pt reports back pain also. Pt has a child that tested yesterday . Child positive for PNA and flu.

## 2022-12-02 NOTE — Discharge Instructions (Signed)
Advised take Tamiflu 75 mg every 12 hours to treat the flu but also as a preventive measure since you have flu a and flu B in the household. Advised take Phenergan DM, 1 teaspoon every 6 hours on a regular basis to help control cough and congestion.  (This medicine may make you drowsy so use with caution) Advised take ibuprofen or Tylenol as needed for body aches, pain and fever.  Advised follow-up PCP or return to urgent care as needed.

## 2023-01-19 ENCOUNTER — Encounter: Payer: Self-pay | Admitting: Family Medicine

## 2023-01-19 ENCOUNTER — Ambulatory Visit (INDEPENDENT_AMBULATORY_CARE_PROVIDER_SITE_OTHER): Payer: Medicaid Other | Admitting: Family Medicine

## 2023-01-19 VITALS — BP 102/76 | HR 90 | Temp 98.1°F | Ht 62.5 in | Wt 195.7 lb

## 2023-01-19 DIAGNOSIS — R29818 Other symptoms and signs involving the nervous system: Secondary | ICD-10-CM | POA: Diagnosis not present

## 2023-01-19 DIAGNOSIS — G43009 Migraine without aura, not intractable, without status migrainosus: Secondary | ICD-10-CM

## 2023-01-19 DIAGNOSIS — F411 Generalized anxiety disorder: Secondary | ICD-10-CM | POA: Diagnosis not present

## 2023-01-19 DIAGNOSIS — N920 Excessive and frequent menstruation with regular cycle: Secondary | ICD-10-CM

## 2023-01-19 LAB — COMPREHENSIVE METABOLIC PANEL
ALT: 44 U/L — ABNORMAL HIGH (ref 0–35)
AST: 29 U/L (ref 0–37)
Albumin: 4.6 g/dL (ref 3.5–5.2)
Alkaline Phosphatase: 89 U/L (ref 39–117)
BUN: 10 mg/dL (ref 6–23)
CO2: 26 mEq/L (ref 19–32)
Calcium: 8.9 mg/dL (ref 8.4–10.5)
Chloride: 104 mEq/L (ref 96–112)
Creatinine, Ser: 0.55 mg/dL (ref 0.40–1.20)
GFR: 129.92 mL/min (ref >60.00)
Glucose, Bld: 98 mg/dL (ref 70–99)
Potassium: 3.7 mEq/L (ref 3.5–5.1)
Sodium: 139 mEq/L (ref 135–145)
Total Bilirubin: 0.4 mg/dL (ref 0.2–1.2)
Total Protein: 7.7 g/dL (ref 6.0–8.3)

## 2023-01-19 LAB — CBC
HCT: 39.8 % (ref 36.0–46.0)
Hemoglobin: 13.4 g/dL (ref 12.0–15.0)
MCHC: 33.6 g/dL (ref 30.0–36.0)
MCV: 89.2 fl (ref 78.0–100.0)
Platelets: 334 10*3/uL (ref 150.0–400.0)
RBC: 4.46 Mil/uL (ref 3.87–5.11)
RDW: 13.7 % (ref 11.5–15.5)
WBC: 7.2 10*3/uL (ref 4.0–10.5)

## 2023-01-19 LAB — TSH: TSH: 1.32 u[IU]/mL (ref 0.35–5.50)

## 2023-01-19 MED ORDER — SUMATRIPTAN SUCCINATE 50 MG PO TABS: 50.0000 mg | ORAL_TABLET | Freq: Every day | ORAL | 5 refills | Status: DC | PRN

## 2023-01-19 MED ORDER — VENLAFAXINE HCL ER 37.5 MG PO CP24: 37.5000 mg | ORAL_CAPSULE | Freq: Every day | ORAL | 5 refills | Status: DC

## 2023-01-19 MED ORDER — IBUPROFEN 800 MG PO TABS
800.0000 mg | ORAL_TABLET | Freq: Three times a day (TID) | ORAL | 5 refills | Status: AC | PRN
Start: 2023-01-19 — End: ?

## 2023-01-19 NOTE — Progress Notes (Unsigned)
New Patient Office Visit  Subjective    Patient ID: Donna Fernandez, female    DOB: 02-16-2001  Age: 22 y.o. MRN: [id number]  CC:  Chief Complaint  Patient presents with   Establish Care    HPI Donna Fernandez presents to establish care States she was previously seen by her pediatrician. States she is not taking any medications currently.   Pt is reporting a history of frequent migraines. States that they are on the right side of the forehead, states she gets blurry vision in both eyes. States that she has to lie down, also gets dizzy. States they last several hours at a time, took tylenol without improvement. Frequency is around 1-2 times per month, then she gets weekly headaches but not like her migraines. Random, not associated with periods. States her pediatrician was giving her ibuprofen. Associated symptoms include nausea.   Pt is reporting increasing anxiety, states that she is having anxiety without any real reason, states she is very fidgety and cannot stay still, states that occasionally she has low energy and doesn't feel like she is sleeping well.   Pt is also reporting irregular periods. States she had a tubal ligation, has had 1 pregnancy, gave birth about a year ago, states that's when she noticed that she started having very heavy periods, her cycle is longer -- going 35-45 days in between periods and the bleeding lasts for 6-8 days.   Past Medical History:  Diagnosis Date   Anemia    Medical history non-contributory    Otitis media, recurrent     Past Surgical History:  Procedure Laterality Date   ADENOIDECTOMY     APPENDECTOMY     LAPAROSCOPIC APPENDECTOMY N/A 07/29/2013   Procedure: APPENDECTOMY LAPAROSCOPIC;  Surgeon: Jerilynn Mages. Gerald Stabs, MD;  Location: [facility];  Service: Pediatrics;  Laterality: N/A;   TUBAL LIGATION N/A 12/20/2021   Procedure: POST PARTUM TUBAL LIGATION;  Surgeon: Truett Mainland, DO;  Location: [facility];  Service: Obstetrics;   Laterality: N/A;   TYMPANOSTOMY TUBE PLACEMENT      Family History  Problem Relation Age of Onset   Asthma Mother    Diabetes type II Maternal Uncle    Diabetes type II Maternal Aunt     Social History   Socioeconomic History   Marital status: Single    Spouse name: Not on file   Number of children: Not on file   Years of education: Not on file   Highest education level: Not on file  Occupational History   Not on file  Tobacco Use   Smoking status: Every Day    Types: Cigarettes   Smokeless tobacco: Current   Tobacco comments:    Occasional vaping  Vaping Use   Vaping Use: Never used  Substance and Sexual Activity   Alcohol use: Not Currently   Drug use: No   Sexual activity: Yes    Birth control/protection: None  Other Topics Concern   Not on file  Social History Narrative   Lives w/ parents and 3 siblings. 1 dog outside.   Social Determinants of Health   Financial Resource Strain: Not on file  Food Insecurity: Not on file  Transportation Needs: Not on file  Physical Activity: Not on file  Stress: Not on file  Social Connections: Not on file  Intimate Partner Violence: Not on file    Review of Systems  Eyes:  Positive for blurred vision and photophobia.  Neurological:  Negative for tingling,  sensory change, focal weakness, seizures and weakness.  All other systems reviewed and are negative.       Objective    BP 102/76 (BP Location: Left Arm, Patient Position: Sitting, Cuff Size: Large)   Pulse 90   Temp 98.1 F (36.7 C) (Oral)   Ht 5' 2.5" (1.588 m)   Wt 195 lb 11.2 oz (88.8 kg)   LMP 01/17/2023 (Exact Date)   SpO2 99%   BMI 35.22 kg/m   Physical Exam Vitals reviewed.  Constitutional:      Appearance: Normal appearance. She is well-groomed and normal weight.  Eyes:     Conjunctiva/sclera: Conjunctivae normal.  Neck:     Thyroid: No thyromegaly.  Cardiovascular:     Rate and Rhythm: Normal rate and regular rhythm.     Pulses: Normal  pulses.     Heart sounds: S1 normal and S2 normal.  Pulmonary:     Effort: Pulmonary effort is normal.     Breath sounds: Normal breath sounds and air entry.  Abdominal:     General: Bowel sounds are normal.  Musculoskeletal:     Right lower leg: No edema.     Left lower leg: No edema.  Neurological:     Mental Status: She is alert and oriented to person, place, and time. Mental status is at baseline.     Gait: Gait is intact.  Psychiatric:        Mood and Affect: Mood and affect normal.        Speech: Speech normal.        Behavior: Behavior normal.        Judgment: Judgment normal.         Assessment & Plan:   Problem List Items Addressed This Visit       Unprioritized   Migraine without aura and without status migrainosus, not intractable - Primary    We discussed various medical treatments including NSAIDS, triptans, and possibly using a once daily medication to reduce the frequency of headaches. For the migraines specifically will give her some sumatriptan 50 mg. 1-2 tablets PRN at the onset of headache. The blurry vision that occurs may be a neurologic sign. I will also order a basic set of blood work to rule out any other causes of her headaches.       Relevant Medications   SUMAtriptan (IMITREX) 50 MG tablet   venlafaxine XR (EFFEXOR XR) 37.5 MG 24 hr capsule   ibuprofen (ADVIL) 800 MG tablet   Other Relevant Orders   CMP (Completed)   CBC (no diff) (Completed)   TSH (Completed)   CT HEAD WO CONTRAST (5MM)   Generalized anxiety disorder       01/19/2023   10:12 AM  GAD 7 : Generalized Anxiety Score  Nervous, Anxious, on Edge 1  Control/stop worrying 2  Worry too much - different things 2  Trouble relaxing 2  Restless 2  Easily annoyed or irritable 3  Afraid - awful might happen 2  Total GAD 7 Score 14  Anxiety Difficulty Somewhat difficult   GAD score is elevated. I do think that treating with medication will help reduce her daily anxiety and also  choosing effexor may also help reduce her headaches. I will start her on effexor 37.5 mg once daily, eventually increasing to 75 mg once daily. I will follow up with her short term to re-evaluate her sx.       Relevant Medications   venlafaxine XR (EFFEXOR XR) 37.5  MG 24 hr capsule   Menorrhagia with regular cycle    Pt does not wish to start hormonal therapy at this time. I recommended using ibuprofen 800 mg every 8 hours for the first 2-3 days of her menstrual cycle in order to reduce the amount of bleeding.       Relevant Medications   ibuprofen (ADVIL) 800 MG tablet   Headache with neurologic deficit    Ordering CT of head to make sure there are no underlying abnormalities. Headaches are very frequent and she is exhibiting blurry vision and dizziness.      Relevant Medications   SUMAtriptan (IMITREX) 50 MG tablet   venlafaxine XR (EFFEXOR XR) 37.5 MG 24 hr capsule   ibuprofen (ADVIL) 800 MG tablet   Other Relevant Orders   CT HEAD WO CONTRAST (5MM)    Return in about 6 weeks (around 03/02/2023) for video visit for follow up on headaches and anxiety.   Farrel Conners, MD

## 2023-01-24 ENCOUNTER — Ambulatory Visit (HOSPITAL_BASED_OUTPATIENT_CLINIC_OR_DEPARTMENT_OTHER)
Admission: RE | Admit: 2023-01-24 | Discharge: 2023-01-24 | Disposition: A | Discharge status: Home / Self Care | Payer: Medicaid Other | Source: Ambulatory Visit | Attending: Family Medicine | Admitting: Family Medicine

## 2023-01-24 DIAGNOSIS — N921 Excessive and frequent menstruation with irregular cycle: Secondary | ICD-10-CM | POA: Insufficient documentation

## 2023-01-24 DIAGNOSIS — G43009 Migraine without aura, not intractable, without status migrainosus: Secondary | ICD-10-CM | POA: Insufficient documentation

## 2023-01-24 DIAGNOSIS — R519 Headache, unspecified: Secondary | ICD-10-CM | POA: Insufficient documentation

## 2023-01-24 DIAGNOSIS — R29818 Other symptoms and signs involving the nervous system: Secondary | ICD-10-CM | POA: Insufficient documentation

## 2023-01-24 DIAGNOSIS — N920 Excessive and frequent menstruation with regular cycle: Secondary | ICD-10-CM | POA: Insufficient documentation

## 2023-01-24 DIAGNOSIS — F411 Generalized anxiety disorder: Secondary | ICD-10-CM | POA: Insufficient documentation

## 2023-01-24 NOTE — Assessment & Plan Note (Signed)
Ordering CT of head to make sure there are no underlying abnormalities. Headaches are very frequent and she is exhibiting blurry vision and dizziness.

## 2023-01-24 NOTE — Assessment & Plan Note (Signed)
Pt does not wish to start hormonal therapy at this time. I recommended using ibuprofen 800 mg every 8 hours for the first 2-3 days of her menstrual cycle in order to reduce the amount of bleeding.

## 2023-01-24 NOTE — Assessment & Plan Note (Signed)
    01/19/2023   10:12 AM  GAD 7 : Generalized Anxiety Score  Nervous, Anxious, on Edge 1  Control/stop worrying 2  Worry too much - different things 2  Trouble relaxing 2  Restless 2  Easily annoyed or irritable 3  Afraid - awful might happen 2  Total GAD 7 Score 14  Anxiety Difficulty Somewhat difficult    GAD score is elevated. I do think that treating with medication will help reduce her daily anxiety and also choosing effexor may also help reduce her headaches. I will start her on effexor 37.5 mg once daily, eventually increasing to 75 mg once daily. I will follow up with her short term to re-evaluate her sx.

## 2023-01-24 NOTE — Assessment & Plan Note (Signed)
We discussed various medical treatments including NSAIDS, triptans, and possibly using a once daily medication to reduce the frequency of headaches. For the migraines specifically will give her some sumatriptan 50 mg. 1-2 tablets PRN at the onset of headache. The blurry vision that occurs may be a neurologic sign. I will also order a basic set of blood work to rule out any other causes of her headaches.

## 2023-02-15 ENCOUNTER — Ambulatory Visit: Payer: Medicaid Other | Admitting: Family Medicine

## 2023-03-23 ENCOUNTER — Ambulatory Visit: Payer: Medicaid Other | Admitting: Family Medicine

## 2023-03-25 ENCOUNTER — Encounter (HOSPITAL_COMMUNITY): Payer: Self-pay

## 2023-03-25 ENCOUNTER — Ambulatory Visit (HOSPITAL_COMMUNITY)
Admission: RE | Admit: 2023-03-25 | Discharge: 2023-03-25 | Disposition: A | Discharge status: Home / Self Care | Payer: Medicaid Other | Source: Ambulatory Visit | Attending: Emergency Medicine | Admitting: Emergency Medicine

## 2023-03-25 VITALS — BP 108/69 | HR 82 | Temp 98.1°F | Resp 18

## 2023-03-25 DIAGNOSIS — H6692 Otitis media, unspecified, left ear: Secondary | ICD-10-CM

## 2023-03-25 MED ORDER — CIPROFLOXACIN-DEXAMETHASONE 0.3-0.1 % OT SUSP: 4.0000 [drp] | Freq: Two times a day (BID) | OTIC | 0 refills | Status: DC

## 2023-03-25 NOTE — Discharge Instructions (Signed)
Declined avs 

## 2023-03-25 NOTE — ED Triage Notes (Signed)
Ear fullness in the left ear onset 2-3 days. Having ear pain but no drainage. Patient has h/o of ear issues and had tubes in the ears.  No trauma to the ear, nothing stuck into the ear.

## 2023-03-25 NOTE — ED Provider Notes (Signed)
[facility]    CSN: [id number] Arrival date & time: 03/25/23  1207      History   Chief Complaint Chief Complaint  Patient presents with   Ear Fullness    Entered by patient    HPI GISELLE LEGORE is a 22 y.o. female.   Patient presents for evaluation of left-sided ear fullness, pain present for 2 to 3 days.  Associated pruritus and nasal congestion.  History of reoccurring infections with tympanostomy placement.  Has attempted use of Tylenol which has been minimally helpful.  Denies decreased hearing, fevers.  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 Leonia Corona, MD;  Location: [facility];  Service: Pediatrics;  Laterality: N/A;   TUBAL LIGATION N/A 12/20/2021   Procedure: POST PARTUM TUBAL LIGATION;  Surgeon: Levie Heritage, DO;  Location: [facility];  Service: Obstetrics;  Laterality: N/A;   TYMPANOSTOMY TUBE PLACEMENT      OB History     Gravida  2   Para  2   Term  2   Preterm      AB      Living  2      SAB      IAB      Ectopic      Multiple  0   Live Births  2             Home Medications    Prior to Admission medications   Medication Sig Start Date End Date Taking? Authorizing Provider  ibuprofen (ADVIL) 800 MG tablet Take 1 tablet (800 mg total) by mouth every 8 (eight) hours as needed. 01/19/23  Yes Karie Georges, MD  SUMAtriptan (IMITREX) 50 MG tablet Take 1 tablet (50 mg total) by mouth daily as needed for migraine. May repeat in 2 hours if headache persists or recurs. 01/19/23  Yes Karie Georges, MD  venlafaxine XR (EFFEXOR XR) 37.5 MG 24 hr capsule Take 1 capsule (37.5 mg total) by mouth daily with breakfast. 01/19/23  Yes Karie Georges, MD  Norethindrone Acetate-Ethinyl Estradiol (JUNEL 1.5/30) 1.5-30 MG-MCG tablet Take 1 tablet by mouth daily. 05/20/20 02/15/21  Georges Mouse, NP    Family History Family History  Problem Relation Age of Onset   Asthma Mother    Diabetes type II Maternal Uncle    Diabetes type II Maternal Aunt     Social History Social History   Tobacco Use   Smoking status: Every Day    Types: Cigarettes   Smokeless tobacco: Current   Tobacco comments:    Occasional vaping  Vaping Use   Vaping  Use: Never used  Substance Use Topics   Alcohol use: Not Currently   Drug use: No     Allergies   Patient has no known allergies.   Review of Systems Review of Systems   Physical Exam Triage Vital Signs ED Triage Vitals  Enc Vitals Group     BP 03/25/23 1232 108/69     Pulse Rate 03/25/23 1232 82     Resp 03/25/23 1232 18     Temp 03/25/23 1232 98.1 F (36.7 C)     Temp Source 03/25/23 1232 Oral     SpO2 03/25/23 1232 98 %     Weight --      Height --      Head Circumference --      Peak Flow --      Pain Score 03/25/23 1231 9     Pain Loc --      Pain Edu? --      Excl. in GC? --    No data found.  Updated Vital Signs BP 108/69 (BP Location: Right Arm)   Pulse 82   Temp 98.1 F (36.7 C) (Oral)   Resp 18   LMP 03/23/2023 (Exact Date)   SpO2 98%   Breastfeeding Yes    Visual Acuity Right Eye Distance:   Left Eye Distance:   Bilateral Distance:    Right Eye Near:   Left Eye Near:    Bilateral Near:     Physical Exam Constitutional:      Appearance: Normal appearance.  HENT:     Right Ear: Tympanic membrane, ear canal and external ear normal.     Ears:     Comments: Shanieka Blea pus drainage within the left ear canal without erythema, swelling, no abnormalities to the tympanic membrane, tympanostomy tube in place Eyes:     Extraocular Movements: Extraocular movements intact.  Pulmonary:     Effort: Pulmonary effort is normal.  Neurological:     Mental Status: She is alert and oriented to person, place, and time. Mental status is at baseline.      UC Treatments / Results  Labs (all labs ordered are listed, but only abnormal results are displayed) Labs Reviewed - No data to display  EKG   Radiology No results found.  Procedures Procedures (including critical care time)  Medications Ordered in UC Medications - No data to display  Initial Impression / Assessment and Plan / UC Course  I have reviewed the triage vital signs and the nursing notes.  Pertinent labs & imaging results that were available during my care of the patient were reviewed by me and considered in my medical decision making (see chart for details).  Infective otitis externa of the left ear  Drainage noted on exam, Ciprodex prescribed and discussed administration, may continue use of over-the-counter analgesics as well as warm compresses to the external ear, advised against ear cleaning or object placement within the canal, may follow-up with his urgent care or her ENT specialist if symptoms persist or worsen Final Clinical Impressions(s) / UC Diagnoses   Final diagnoses:  None   Discharge Instructions   None    ED Prescriptions   None    PDMP not reviewed this encounter.   Valinda Hoar, NP 03/25/23 1252

## 2023-08-02 ENCOUNTER — Ambulatory Visit: Payer: Medicaid Other | Admitting: Family Medicine

## 2023-08-02 ENCOUNTER — Other Ambulatory Visit (HOSPITAL_COMMUNITY)
Admission: RE | Admit: 2023-08-02 | Discharge: 2023-08-02 | Disposition: A | Discharge status: Home / Self Care | Payer: Medicaid Other | Source: Ambulatory Visit | Attending: *Deleted | Admitting: *Deleted

## 2023-08-02 ENCOUNTER — Encounter: Payer: Self-pay | Admitting: Family Medicine

## 2023-08-02 VITALS — BP 112/80 | HR 93 | Temp 98.8°F | Ht 62.5 in | Wt 202.6 lb

## 2023-08-02 DIAGNOSIS — G43009 Migraine without aura, not intractable, without status migrainosus: Secondary | ICD-10-CM

## 2023-08-02 DIAGNOSIS — N760 Acute vaginitis: Secondary | ICD-10-CM

## 2023-08-02 DIAGNOSIS — N898 Other specified noninflammatory disorders of vagina: Secondary | ICD-10-CM | POA: Diagnosis present

## 2023-08-02 DIAGNOSIS — Z6836 Body mass index (BMI) 36.0-36.9, adult: Secondary | ICD-10-CM | POA: Diagnosis not present

## 2023-08-02 DIAGNOSIS — N921 Excessive and frequent menstruation with irregular cycle: Secondary | ICD-10-CM

## 2023-08-02 DIAGNOSIS — R635 Abnormal weight gain: Secondary | ICD-10-CM | POA: Diagnosis not present

## 2023-08-02 DIAGNOSIS — B9689 Other specified bacterial agents as the cause of diseases classified elsewhere: Secondary | ICD-10-CM

## 2023-08-02 DIAGNOSIS — E669 Obesity, unspecified: Secondary | ICD-10-CM

## 2023-08-02 LAB — CBC WITH DIFFERENTIAL/PLATELET
Basophils Absolute: 0 10*3/uL (ref 0.0–0.1)
Basophils Relative: 0.4 % (ref 0.0–3.0)
Eosinophils Absolute: 0.1 10*3/uL (ref 0.0–0.7)
Eosinophils Relative: 2.5 % (ref 0.0–5.0)
HCT: 41.9 % (ref 36.0–46.0)
Hemoglobin: 13.6 g/dL (ref 12.0–15.0)
Lymphocytes Relative: 21.3 % (ref 12.0–46.0)
Lymphs Abs: 1.3 10*3/uL (ref 0.7–4.0)
MCHC: 32.4 g/dL (ref 30.0–36.0)
MCV: 90.8 fL (ref 78.0–100.0)
Monocytes Absolute: 0.3 10*3/uL (ref 0.1–1.0)
Monocytes Relative: 5.9 % (ref 3.0–12.0)
Neutro Abs: 4.1 10*3/uL (ref 1.4–7.7)
Neutrophils Relative %: 69.9 % (ref 43.0–77.0)
Platelets: 288 10*3/uL (ref 150.0–400.0)
RBC: 4.61 Mil/uL (ref 3.87–5.11)
RDW: 13.3 % (ref 11.5–15.5)
WBC: 5.9 10*3/uL (ref 4.0–10.5)

## 2023-08-02 LAB — TSH: TSH: 0.58 u[IU]/mL (ref 0.35–5.50)

## 2023-08-02 LAB — FOLLICLE STIMULATING HORMONE: FSH: 3.6 m[IU]/mL

## 2023-08-02 LAB — LUTEINIZING HORMONE: LH: 1.92 m[IU]/mL

## 2023-08-02 LAB — HEMOGLOBIN A1C: Hgb A1c MFr Bld: 5.4 % (ref 4.6–6.5)

## 2023-08-02 LAB — TESTOSTERONE: Testosterone: 22.96 ng/dL (ref 15.00–40.00)

## 2023-08-02 MED ORDER — WEGOVY 0.5 MG/0.5ML ~~LOC~~ SOAJ: 0.5000 mg | SUBCUTANEOUS | 0 refills | Status: DC

## 2023-08-02 MED ORDER — NURTEC 75 MG PO TBDP: 75.0000 mg | ORAL_TABLET | Freq: Every day | ORAL | 1 refills | Status: DC | PRN

## 2023-08-02 MED ORDER — WEGOVY 1 MG/0.5ML ~~LOC~~ SOAJ: 1.0000 mg | SUBCUTANEOUS | 0 refills | Status: DC

## 2023-08-02 MED ORDER — WEGOVY 0.25 MG/0.5ML ~~LOC~~ SOAJ: 0.2500 mg | SUBCUTANEOUS | 0 refills | Status: DC

## 2023-08-02 NOTE — Assessment & Plan Note (Signed)
Sumtriptan caused severe nausea and vomiting, and the effexor could have cause weight gain. Will try Nurtec for her as needed daily for migraines.

## 2023-08-02 NOTE — Assessment & Plan Note (Signed)
Unclear etiology, will check a full hormone panel and STI testing.

## 2023-08-02 NOTE — Progress Notes (Signed)
Established Patient Office Visit  Subjective   Patient ID: Donna Fernandez, female    DOB: 02/26/2001  Age: 22 y.o. MRN: [id number]  Chief Complaint  Patient presents with   Vaginal Discharge    Patient complains of brown vaginal discharge for the past 2.5 weeks, concerned due to history of chlamydia   Nausea    X2 weeks    Patient is reporting some nausea and brown vaginal discharge, states that there is some "sharp pains" in the lower abdomen. States that there is no fever or chills. States that her periods have been very irregular, states that she is getting them in between her regular period-- states that she is getting 2-3 periods per month now. States that this has been going on for the past 4 months or so.   Pt reports that she is gaining weight, states that she is 10 pounds heavier now, has been cutting out sugar from her diet and also working out at the gym daily. States that it is not helping at all. States that she started the KETO diet  about 4 months ago, has cut out all carbs essentially and it has not helped. Has been adhering to the diet and it has not worked for her this time. States that she was successful with this in the past but not now.   Migraines-- pt reports that the sumatriptan caused severe nausea and vomiting every time she took it for her migraine. She also stopped the effexor because she thought this was causing the weight gain.   ADHD-- pt reports that she is having difficulty concentrating. States that she has been studying and is taking tests in order to get into nursing school, states that she is having trouble staying on/finishing a  task and organizing herself. States she has trouble sitting still, usually tapping her foot or moving her leg. Has trouble sitting for long periods, feels like she has to be up doing something all the time. States that she feels randomly distracted, jumps from one topic to another frequently. States that she has trouble with  mind wandering. States she felt like she had issues as a kid also. States that the nurses she works with have told her that she has ADHD. Has a family history-- little brother has ADHD.  Vaginal Discharge The patient's primary symptoms include vaginal discharge. This is a new problem. The current episode started more than 1 month ago. The problem occurs constantly. She uses tubal ligation for contraception. Her menstrual history has been irregular. Her past medical history is significant for metrorrhagia.    Current Outpatient Medications  Medication Instructions   ibuprofen (ADVIL) 800 mg, Oral, Every 8 hours PRN   Nurtec 75 mg, Oral, Daily PRN   Wegovy 0.25 mg, Subcutaneous, Weekly   [START ON 08/31/2023] Wegovy 0.5 mg, Subcutaneous, Weekly   [START ON 09/27/2023] Wegovy 1 mg, Subcutaneous, Weekly      Review of Systems  Genitourinary:  Positive for vaginal discharge.  All other systems reviewed and are negative.     Objective:     BP 112/80 (BP Location: Left Arm, Patient Position: Sitting, Cuff Size: Large)   Pulse 93   Temp 98.8 F (37.1 C) (Oral)   Ht 5' 2.5" (1.588 m)   Wt 202 lb 9.6 oz (91.9 kg)   LMP 07/01/2023 (Exact Date)   SpO2 99%   Breastfeeding No   BMI 36.47 kg/m    Physical Exam Vitals reviewed.  Constitutional:  Appearance: Normal appearance. She is well-groomed. She is obese.  Neck:     Thyroid: No thyromegaly.  Cardiovascular:     Rate and Rhythm: Normal rate and regular rhythm.     Pulses: Normal pulses.     Heart sounds: S1 normal and S2 normal.  Pulmonary:     Effort: Pulmonary effort is normal.     Breath sounds: Normal breath sounds and air entry.  Musculoskeletal:     Right lower leg: No edema.     Left lower leg: No edema.  Neurological:     Mental Status: She is alert and oriented to person, place, and time. Mental status is at baseline.     Gait: Gait is intact.  Psychiatric:        Mood and Affect: Mood and affect normal.         Speech: Speech normal.        Behavior: Behavior normal.        Judgment: Judgment normal.      No results found for any visits on 08/02/23.    The ASCVD Risk score (Arnett DK, et al., 2019) failed to calculate for the following reasons:   The 2019 ASCVD risk score is only valid for ages 88 to 35    Assessment & Plan:  Vaginal discharge -     Cervicovaginal ancillary only  Menometrorrhagia Assessment & Plan: Unclear etiology, will check a full hormone panel and STI testing.   Orders: -     Testosterone; Future -     CBC with Differential/Platelet -     TSH -     Prolactin -     Luteinizing hormone -     Follicle stimulating hormone  Weight gain -     Hemoglobin A1c  Migraine without aura and without status migrainosus, not intractable Assessment & Plan: Sumtriptan caused severe nausea and vomiting, and the effexor could have cause weight gain. Will try Nurtec for her as needed daily for migraines.   Orders: -     Nurtec; Take 1 tablet (75 mg total) by mouth daily as needed.  Dispense: 30 tablet; Refill: 1  Obesity (BMI 30-39.9) Assessment & Plan: I have had an extensive 30 minute conversation today with the patient about healthy eating habits, exercise, calorie and carb goals for sustainable and successful weight loss. I gave the patient caloric and protein daily intake values as well as described the importance of increasing fiber and water intake. I discussed weight loss medications that could be used in the treatment of this patient. Handouts on low carb eating were given to the patient.    Patient's co morbid condition is irregular periods which can be exacerbated by weight gain.   Since the patient has failed diet and exercise she would be an excellent candidate for [facility] therapy. I will place orders and see her back in 3 months. Patient will continue her daily workouts at the gym and will continue the keto/ low carb diet under my surveillance.   Orders: -      Wegovy; Inject 0.25 mg into the skin once a week.  Dispense: 2 mL; Refill: 0 -     Wegovy; Inject 0.5 mg into the skin once a week.  Dispense: 2 mL; Refill: 0 -     Wegovy; Inject 1 mg into the skin once a week.  Dispense: 2 mL; Refill: 0     Return in about 3 months (around 11/02/2023) for weight loss.  Karie Georges, MD

## 2023-08-02 NOTE — Assessment & Plan Note (Signed)
I have had an extensive 30 minute conversation today with the patient about healthy eating habits, exercise, calorie and carb goals for sustainable and successful weight loss. I gave the patient caloric and protein daily intake values as well as described the importance of increasing fiber and water intake. I discussed weight loss medications that could be used in the treatment of this patient. Handouts on low carb eating were given to the patient.    Patient's co morbid condition is irregular periods which can be exacerbated by weight gain.   Since the patient has failed diet and exercise she would be an excellent candidate for [facility] therapy. I will place orders and see her back in 3 months. Patient will continue her daily workouts at the gym and will continue the keto/ low carb diet under my surveillance.

## 2023-08-03 LAB — PROLACTIN: Prolactin: 16.4 ng/mL

## 2023-08-06 ENCOUNTER — Ambulatory Visit (HOSPITAL_COMMUNITY): Payer: Self-pay

## 2023-08-06 ENCOUNTER — Encounter: Payer: Self-pay | Admitting: Family Medicine

## 2023-08-06 DIAGNOSIS — N921 Excessive and frequent menstruation with irregular cycle: Secondary | ICD-10-CM

## 2023-08-06 LAB — CERVICOVAGINAL ANCILLARY ONLY
Bacterial Vaginitis (gardnerella): POSITIVE — AB
Candida Glabrata: NEGATIVE
Candida Vaginitis: NEGATIVE
Chlamydia: NEGATIVE
Comment: NEGATIVE
Comment: NEGATIVE
Comment: NEGATIVE
Comment: NEGATIVE
Comment: NORMAL
Neisseria Gonorrhea: NEGATIVE

## 2023-08-06 MED ORDER — METRONIDAZOLE 500 MG PO TABS: 500.0000 mg | ORAL_TABLET | Freq: Two times a day (BID) | ORAL | 0 refills | Status: AC

## 2023-08-06 NOTE — Addendum Note (Signed)
Addended by: Karie Georges on: 08/06/2023 08:37 AM   Modules accepted: Orders

## 2023-08-16 ENCOUNTER — Telehealth: Payer: Self-pay

## 2023-08-16 ENCOUNTER — Other Ambulatory Visit (HOSPITAL_COMMUNITY): Payer: Self-pay

## 2023-08-16 NOTE — Telephone Encounter (Signed)
Pharmacy Patient Advocate Encounter   Received notification from CoverMyMeds that prior authorization for Wegovy 0.5MG /0.5ML auto-injectors is required/requested.   Insurance verification completed.   The patient is insured through [facility] MEDICAID .   Per test claim: PA required; PA submitted to [facility] MEDICAID via CoverMyMeds Key/confirmation #/EOC [id number] Status is pending

## 2023-08-17 NOTE — Telephone Encounter (Signed)
Pharmacy Patient Advocate Encounter  Received notification from [facility] MEDICAID that Prior Authorization for [facility] 0.5MG /0.5ML auto-injectors has been APPROVED from 08/16/2023 to 02/14/2024   PA #/Case ID/Reference #: [id number]

## 2023-08-28 ENCOUNTER — Encounter: Payer: Self-pay | Admitting: Family Medicine

## 2023-08-28 DIAGNOSIS — B3731 Acute candidiasis of vulva and vagina: Secondary | ICD-10-CM

## 2023-08-29 MED ORDER — FLUCONAZOLE 150 MG PO TABS: 150.0000 mg | ORAL_TABLET | Freq: Every day | ORAL | 0 refills | Status: DC

## 2023-09-04 ENCOUNTER — Ambulatory Visit (HOSPITAL_COMMUNITY)
Admission: RE | Admit: 2023-09-04 | Discharge: 2023-09-04 | Disposition: A | Discharge status: Home / Self Care | Payer: Medicaid Other | Source: Ambulatory Visit | Attending: Family Medicine | Admitting: Family Medicine

## 2023-09-04 ENCOUNTER — Encounter (HOSPITAL_COMMUNITY): Payer: Self-pay

## 2023-09-04 VITALS — BP 112/76 | HR 73 | Temp 98.3°F | Resp 16

## 2023-09-04 DIAGNOSIS — H66005 Acute suppurative otitis media without spontaneous rupture of ear drum, recurrent, left ear: Secondary | ICD-10-CM

## 2023-09-04 MED ORDER — AMOXICILLIN 875 MG PO TABS: 875.0000 mg | ORAL_TABLET | Freq: Two times a day (BID) | ORAL | 0 refills | Status: AC

## 2023-09-04 NOTE — Discharge Instructions (Signed)
You were seen today for ear pain.  I have diagnosed you with an ear infection and sent out oral antibiotic to take twice/day x 10 days.  You should use over the counter claritin/zyrtec as well.  I recommend motrin/tylenol for pain.  Please return if not improving as expected.

## 2023-09-04 NOTE — ED Provider Notes (Signed)
[facility]    CSN: [id number] Arrival date & time: 09/04/23  1413      History   Chief Complaint Chief Complaint  Patient presents with   Ear Drainage    Left ear drainage accompanied with pain and fullness - Entered by patient    HPI Donna Fernandez is a 22 y.o. female.    Ear Drainage  Patient is here for left ear pain and drainage that started today.  Yellow in color.  No runny nose, congestion, drainage.  Her child has been sick.  She does have h/o ear infections, usually once/year around this time.       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 Leonia Corona, MD;  Location: [facility];  Service: Pediatrics;  Laterality: N/A;   TUBAL LIGATION N/A 12/20/2021   Procedure: POST PARTUM TUBAL LIGATION;  Surgeon: Levie Heritage, DO;  Location: [facility];  Service: Obstetrics;  Laterality: N/A;   TYMPANOSTOMY TUBE PLACEMENT      OB History     Gravida  2   Para  2   Term  2   Preterm      AB      Living  2      SAB      IAB      Ectopic       Multiple  0   Live Births  2            Home Medications    Prior to Admission medications   Medication Sig Start Date End Date Taking? Authorizing Provider  fluconazole (DIFLUCAN) 150 MG tablet Take 1 tablet (150 mg total) by mouth daily. 08/29/23   Karie Georges, MD  ibuprofen (ADVIL) 800 MG tablet Take 1 tablet (800 mg total) by mouth every 8 (eight) hours as needed. 01/19/23   Karie Georges, MD  Rimegepant Sulfate (NURTEC) 75 MG TBDP Take 1 tablet (75 mg total) by mouth daily as needed. 08/02/23   Karie Georges, MD  Semaglutide-Weight Management [facility]) 0.25 MG/0.5ML SOAJ Inject 0.25 mg into the skin once a week. 08/02/23   Karie Georges, MD  Semaglutide-Weight Management [facility]) 0.5 MG/0.5ML SOAJ Inject 0.5 mg into the skin once a week. 08/31/23   Karie Georges, MD  Semaglutide-Weight Management [facility]) 1 MG/0.5ML SOAJ Inject 1 mg into the skin once a week. 09/27/23   Karie Georges, MD  Norethindrone Acetate-Ethinyl Estradiol (JUNEL 1.5/30) 1.5-30 MG-MCG tablet Take 1 tablet by mouth daily. 05/20/20 02/15/21  Georges Mouse, NP    Family History Family History  Problem Relation Age of Onset   Asthma Mother    Diabetes type II Maternal Uncle    Diabetes type II Maternal Aunt     Social History Social History   Tobacco Use   Smoking status: Every Day    Types: Cigarettes   Smokeless tobacco: Current   Tobacco comments:    Occasional vaping  Vaping Use   Vaping status: Never Used  Substance Use Topics   Alcohol use: Not Currently   Drug use: No     Allergies   Patient has no known allergies.   Review of Systems Review of Systems  Constitutional: Negative.   HENT:  Positive for ear discharge and ear pain.   Respiratory: Negative.    Cardiovascular: Negative.   Gastrointestinal: Negative.   Musculoskeletal: Negative.   Psychiatric/Behavioral: Negative.       Physical Exam Triage Vital Signs ED Triage Vitals  Encounter  Vitals Group     BP 09/04/23 1434 112/76     Systolic BP Percentile --      Diastolic BP Percentile --      Pulse Rate 09/04/23 1434 73     Resp 09/04/23 1434 16     Temp 09/04/23 1434 98.3 F (36.8 C)     Temp Source 09/04/23 1434 Oral     SpO2 09/04/23 1434 98 %     Weight --      Height --      Head Circumference --      Peak Flow --      Pain Score 09/04/23 1435 8     Pain Loc --      Pain Education --      Exclude from Growth Chart --    No data found.  Updated Vital Signs BP 112/76 (BP Location: Left Arm)   Pulse 73   Temp 98.3 F (36.8 C) (Oral)   Resp 16   LMP 08/10/2023 (Approximate)   SpO2 98%   Breastfeeding No   Visual Acuity Right Eye Distance:   Left Eye Distance:   Bilateral Distance:    Right Eye Near:   Left Eye Near:    Bilateral Near:     Physical Exam Constitutional:      Appearance: Normal appearance.  HENT:     Left Ear: A middle ear effusion is present. A PE tube is present. Tympanic membrane is erythematous and bulging.  Cardiovascular:     Rate and Rhythm: Normal rate and regular rhythm.  Pulmonary:     Effort: Pulmonary effort is normal.     Breath sounds: Normal breath sounds.  Musculoskeletal:     Cervical back: Neck supple. Tenderness present.  Skin:    General: Skin is warm.  Neurological:     General: No focal deficit present.     Mental Status: She is alert.  Psychiatric:        Mood and Affect: Mood normal.      UC Treatments / Results  Labs (all labs ordered are listed, but only abnormal results are displayed) Labs Reviewed - No data to display  EKG   Radiology No results found.  Procedures Procedures (including critical care time)  Medications Ordered in UC Medications - No data to display  Initial Impression / Assessment and Plan / UC Course  I have reviewed the triage vital signs and the nursing notes.  Pertinent labs & imaging results that were available during my care of the patient were reviewed  by me and  considered in my medical decision making (see chart for details).   Final Clinical Impressions(s) / UC Diagnoses   Final diagnoses:  Recurrent acute suppurative otitis media without spontaneous rupture of left tympanic membrane     Discharge Instructions      You were seen today for ear pain.  I have diagnosed you with an ear infection and sent out oral antibiotic to take twice/day x 10 days.  You should use over the counter claritin/zyrtec as well.  I recommend motrin/tylenol for pain.  Please return if not improving as expected.     ED Prescriptions     Medication Sig Dispense Auth. Provider   amoxicillin (AMOXIL) 875 MG tablet Take 1 tablet (875 mg total) by mouth 2 (two) times daily for 10 days. 20 tablet Jannifer Franklin, MD      PDMP not reviewed this encounter.   Jannifer Franklin, MD 09/04/23 1446

## 2023-09-04 NOTE — ED Triage Notes (Addendum)
Pt states left ear pain and drainage started today.

## 2023-09-06 ENCOUNTER — Ambulatory Visit: Payer: Medicaid Other | Admitting: Family Medicine

## 2023-09-17 ENCOUNTER — Other Ambulatory Visit: Payer: Self-pay | Admitting: Family Medicine

## 2023-09-17 DIAGNOSIS — E669 Obesity, unspecified: Secondary | ICD-10-CM

## 2023-09-25 ENCOUNTER — Ambulatory Visit: Payer: Medicaid Other | Admitting: Radiology

## 2023-10-06 ENCOUNTER — Encounter (HOSPITAL_COMMUNITY): Payer: Self-pay

## 2023-10-06 ENCOUNTER — Inpatient Hospital Stay (HOSPITAL_COMMUNITY)
Admission: RE | Admit: 2023-10-06 | Discharge: 2023-10-06 | Discharge status: Home / Self Care | Payer: Self-pay | Source: Ambulatory Visit | Attending: Nurse Practitioner

## 2023-10-06 VITALS — BP 117/81 | HR 95 | Temp 98.7°F | Resp 16 | Ht 62.0 in | Wt 198.0 lb

## 2023-10-06 DIAGNOSIS — Z20828 Contact with and (suspected) exposure to other viral communicable diseases: Secondary | ICD-10-CM | POA: Diagnosis not present

## 2023-10-06 DIAGNOSIS — J069 Acute upper respiratory infection, unspecified: Secondary | ICD-10-CM | POA: Diagnosis not present

## 2023-10-06 LAB — POC COVID19/FLU A&B COMBO
Covid Antigen, POC: NEGATIVE
Influenza A Antigen, POC: NEGATIVE
Influenza B Antigen, POC: NEGATIVE

## 2023-10-06 MED ORDER — PROMETHAZINE-DM 6.25-15 MG/5ML PO SYRP
5.0000 mL | ORAL_SOLUTION | Freq: Every evening | ORAL | 0 refills | Status: DC | PRN
Start: 2023-10-06 — End: 2023-12-03

## 2023-10-06 MED ORDER — BENZONATATE 100 MG PO CAPS: 100.0000 mg | ORAL_CAPSULE | Freq: Three times a day (TID) | ORAL | 0 refills | Status: DC | PRN

## 2023-10-06 NOTE — ED Triage Notes (Signed)
Patient here today with c/o cough, fever, runny nose, and headaches X 2 days. She tried taking Tylenol with no relief. Her children are also sick with similar symptoms.

## 2023-10-06 NOTE — ED Provider Notes (Signed)
[facility]    CSN: [id number] Arrival date & time: 10/06/23  1250      History   Chief Complaint Chief Complaint  Patient presents with   Cough    Cough,fever, runny nose. Need testing for covid as well for home at work I'm an employee of [employer]. - Entered by patient    HPI Donna Fernandez is a 22 y.o. female.   Patient presents today with 2-day history of fever, Tmax 102.8 F, bodyaches and chills, congested cough, shortness of breath when she lays down at night, runny and stuffy nose, sore throat, headache, bilateral ear pain without drainage, decreased appetite, and fatigue.  She denies shortness of breath or chest pain at rest or when walking around, abdominal pain, nausea/vomiting, and diarrhea.  Reports her 73-year-old child recently tested positive for RSV.  Has taken Tylenol for the fever which helps temporarily.     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 Leonia Corona, MD;  Location[facility];  Service: Pediatrics;  Laterality: N/A;   TUBAL LIGATION N/A  12/20/2021   Procedure: POST PARTUM TUBAL LIGATION;  Surgeon: Levie Heritage, DO;  [facility];  Service: Obstetrics;  Laterality: N/A;   TYMPANOSTOMY TUBE PLACEMENT      OB History     Gravida  2   Para  2   Term  2   Preterm      AB      Living  2      SAB      IAB      Ectopic      Multiple  0   Live Births  2            Home Medications    Prior to Admission medications   Medication Sig Start Date End Date Taking? Authorizing Provider  benzonatate (TESSALON) 100 MG capsule Take 1 capsule (100 mg total) by mouth 3 (three) times daily as needed for cough. Do not take with alcohol or while driving or operating heavy machinery.  May cause drowsiness. 10/06/23  Yes Valentino Nose, NP  promethazine-dextromethorphan (PROMETHAZINE-DM) 6.25-15 MG/5ML syrup Take 5 mLs by mouth at bedtime as needed for cough. 10/06/23  Yes Cathlean Marseilles A, NP  ibuprofen (ADVIL) 800 MG tablet Take 1 tablet (800 mg total) by mouth every 8 (eight) hours as needed. 01/19/23   Karie Georges, MD  Semaglutide-Weight Management [facility]) 0.5 MG/0.5ML SOAJ Inject 0.5 mg into the skin once a week. 09/18/23   Karie Georges, MD  Semaglutide-Weight Management [facility]) 1 MG/0.5ML SOAJ Inject 1  mg into the skin once a week. 09/27/23   Karie Georges, MD  Norethindrone Acetate-Ethinyl Estradiol (JUNEL 1.5/30) 1.5-30 MG-MCG tablet Take 1 tablet by mouth daily. 05/20/20 02/15/21  Georges Mouse, NP    Family History Family History  Problem Relation Age of Onset   Asthma Mother    Diabetes type II Maternal Uncle    Diabetes type II Maternal Aunt     Social History Social History   Tobacco Use   Smoking status: Every Day    Types: Cigarettes   Smokeless tobacco: Current   Tobacco comments:    Occasional vaping  Vaping Use   Vaping status: Never Used  Substance Use Topics   Alcohol use: Not Currently   Drug use: No     Allergies   Patient has no known  allergies.   Review of Systems Review of Systems Per HPI  Physical Exam Triage Vital Signs ED Triage Vitals [10/06/23 1312]  Encounter Vitals Group     BP 117/81     Systolic BP Percentile      Diastolic BP Percentile      Pulse Rate 95     Resp 16     Temp 98.7 F (37.1 C)     Temp Source Oral     SpO2 97 %     Weight 198 lb (89.8 kg)     Height 5\' 2"  (1.575 m)     Head Circumference      Peak Flow      Pain Score 7     Pain Loc      Pain Education      Exclude from Growth Chart    No data found.  Updated Vital Signs BP 117/81 (BP Location: Left Arm)   Pulse 95   Temp 98.7 F (37.1 C) (Oral)   Resp 16   Ht 5\' 2"  (1.575 m)   Wt 198 lb (89.8 kg)   LMP 09/09/2023 (Exact Date)   SpO2 97%   Breastfeeding No   BMI 36.21 kg/m   Visual Acuity Right Eye Distance:   Left Eye Distance:   Bilateral Distance:    Right Eye Near:   Left Eye Near:    Bilateral Near:     Physical Exam Vitals and nursing note reviewed.  Constitutional:      General: She is not in acute distress.    Appearance: Normal appearance. She is not ill-appearing or toxic-appearing.  HENT:     Head: Normocephalic and atraumatic.     Right Ear: Tympanic membrane, ear canal and external ear normal.     Left Ear: Ear canal and external ear normal. A PE tube is present.     Nose: Congestion and rhinorrhea present.     Mouth/Throat:     Mouth: Mucous membranes are moist.     Pharynx: Oropharynx is clear. Posterior oropharyngeal erythema present. No oropharyngeal exudate.  Eyes:     General: No scleral icterus.    Extraocular Movements: Extraocular movements intact.  Cardiovascular:     Rate and Rhythm: Normal rate and regular rhythm.  Pulmonary:     Effort: Pulmonary effort is normal. No respiratory distress.     Breath sounds: Normal breath sounds. No wheezing, rhonchi or rales.  Musculoskeletal:     Cervical back: Normal range of motion and neck supple.  Lymphadenopathy:     Cervical:  No cervical adenopathy.  Skin:    General: Skin is warm and dry.  Coloration: Skin is not jaundiced or pale.     Findings: No erythema or rash.  Neurological:     Mental Status: She is alert and oriented to person, place, and time.  Psychiatric:        Behavior: Behavior is cooperative.      UC Treatments / Results  Labs (all labs ordered are listed, but only abnormal results are displayed) Labs Reviewed  POC COVID19/FLU A&B COMBO    EKG   Radiology No results found.  Procedures Procedures (including critical care time)  Medications Ordered in UC Medications - No data to display  Initial Impression / Assessment and Plan / UC Course  I have reviewed the triage vital signs and the nursing notes.  Pertinent labs & imaging results that were available during my care of the patient were reviewed by me and considered in my medical decision making (see chart for details).   Patient is well-appearing, normotensive, afebrile, not tachycardic, not tachypneic, oxygenating well on room air.    1. Viral URI with cough 2. Exposure to respiratory syncytial virus (RSV) Suspect viral etiology Vitals and exam are reassuring COVID-19 and influenza test negative today I am suspicious for RSV given known exposure Supportive care discussed, start cough suppressant medication ER and return precautions discussed Work excuse provided  The patient was given the opportunity to ask questions.  All questions answered to their satisfaction.  The patient is in agreement to this plan.    Final Clinical Impressions(s) / UC Diagnoses   Final diagnoses:  Viral URI with cough  Exposure to respiratory syncytial virus (RSV)     Discharge Instructions      You have a viral upper respiratory infection.  Symptoms should improve over the next week to 10 days.  If you develop chest pain or shortness of breath, go to the emergency room.  You tested negative for COVID-19 and influenza today.  I  am suspicious you may have RSV since your child has it.  Some things that can make you feel better are: - Increased rest - Increasing fluid with water/sugar free electrolytes - Acetaminophen and ibuprofen as needed for fever/pain - Salt water gargling, chloraseptic spray and throat lozenges - OTC guaifenesin (Mucinex) 600 mg twice daily - Saline sinus flushes or a neti pot - Humidifying the air -Tessalon Perles every 8 hours as needed for dry cough and cough syrup at night time as needed      ED Prescriptions     Medication Sig Dispense Auth. Provider   benzonatate (TESSALON) 100 MG capsule Take 1 capsule (100 mg total) by mouth 3 (three) times daily as needed for cough. Do not take with alcohol or while driving or operating heavy machinery.  May cause drowsiness. 21 capsule Cathlean Marseilles A, NP   promethazine-dextromethorphan (PROMETHAZINE-DM) 6.25-15 MG/5ML syrup Take 5 mLs by mouth at bedtime as needed for cough. 118 mL Valentino Nose, NP      PDMP not reviewed this encounter.   Valentino Nose, NP 10/06/23 1413

## 2023-10-06 NOTE — Discharge Instructions (Addendum)
You have a viral upper respiratory infection.  Symptoms should improve over the next week to 10 days.  If you develop chest pain or shortness of breath, go to the emergency room.  You tested negative for COVID-19 and influenza today.  I am suspicious you may have RSV since your child has it.  Some things that can make you feel better are: - Increased rest - Increasing fluid with water/sugar free electrolytes - Acetaminophen and ibuprofen as needed for fever/pain - Salt water gargling, chloraseptic spray and throat lozenges - OTC guaifenesin (Mucinex) 600 mg twice daily - Saline sinus flushes or a neti pot - Humidifying the air -Tessalon Perles every 8 hours as needed for dry cough and cough syrup at night time as needed

## 2023-10-16 ENCOUNTER — Encounter: Payer: Self-pay | Admitting: Family Medicine

## 2023-10-16 DIAGNOSIS — E669 Obesity, unspecified: Secondary | ICD-10-CM

## 2023-10-16 MED ORDER — WEGOVY 1 MG/0.5ML ~~LOC~~ SOAJ: 1.0000 mg | SUBCUTANEOUS | 0 refills | Status: DC

## 2023-10-16 NOTE — Telephone Encounter (Signed)
Ok to transfer the script

## 2023-11-13 ENCOUNTER — Encounter: Payer: Self-pay | Admitting: Family Medicine

## 2023-11-13 DIAGNOSIS — E669 Obesity, unspecified: Secondary | ICD-10-CM

## 2023-11-13 MED ORDER — WEGOVY 1.7 MG/0.75ML ~~LOC~~ SOAJ: 1.7000 mg | SUBCUTANEOUS | 0 refills | Status: DC

## 2023-11-18 ENCOUNTER — Ambulatory Visit (HOSPITAL_COMMUNITY): Payer: Self-pay

## 2023-11-27 ENCOUNTER — Ambulatory Visit: Payer: Medicaid Other | Admitting: Radiology

## 2023-12-03 ENCOUNTER — Encounter: Payer: Self-pay | Admitting: Family Medicine

## 2023-12-03 ENCOUNTER — Ambulatory Visit (INDEPENDENT_AMBULATORY_CARE_PROVIDER_SITE_OTHER): Payer: Medicaid Other | Admitting: Family Medicine

## 2023-12-03 VITALS — BP 128/80 | HR 85 | Temp 98.4°F | Ht 62.0 in | Wt 187.4 lb

## 2023-12-03 DIAGNOSIS — E669 Obesity, unspecified: Secondary | ICD-10-CM | POA: Diagnosis not present

## 2023-12-03 DIAGNOSIS — Z6834 Body mass index (BMI) 34.0-34.9, adult: Secondary | ICD-10-CM | POA: Diagnosis not present

## 2023-12-03 MED ORDER — WEGOVY 2.4 MG/0.75ML ~~LOC~~ SOAJ: 2.4000 mg | SUBCUTANEOUS | 5 refills | Status: DC

## 2023-12-03 NOTE — Progress Notes (Signed)
   Established Patient Office Visit  Subjective   Patient ID: Donna Fernandez, female    DOB: [birth year]  Age: 23 y.o. MRN: [id number]  Chief Complaint  Patient presents with   Medical Management of Chronic Issues    Pt is here for weight loss follow up. She reports that the initial nausea she was having is now gone, her body is adjusting to the medication, she reports good control of her appetite and no other side effects from the medication. Is up to 1.7 mg weekly, next month she will increase to 2.4 mg weekly dosing.   Pt also asked me to look in her ears-- states she feels a foreign body in her ear canal and thinks it is her T tube coming out.     Current Outpatient Medications  Medication Instructions   ibuprofen (ADVIL) 800 mg, Oral, Every 8 hours PRN   Wegovy 1.7 mg, Subcutaneous, Weekly   Wegovy 2.4 mg, Subcutaneous, Weekly      Review of Systems  All other systems reviewed and are negative.     Objective:     BP 128/80   Pulse 85   Temp 98.4 F (36.9 C) (Oral)   Ht 5\' 2"  (1.575 m)   Wt 187 lb 6.4 oz (85 kg)   SpO2 99%   BMI 34.28 kg/m    Physical Exam Vitals reviewed.  Constitutional:      Appearance: Normal appearance. She is obese.  HENT:     Right Ear: Tympanic membrane normal.     Left Ear: Tympanic membrane normal.     Ears:     Comments: Left TM shows partially dislodged T tube in the ear canal. Pulmonary:     Effort: Pulmonary effort is normal.  Musculoskeletal:     Right lower leg: No edema.     Left lower leg: No edema.  Neurological:     Mental Status: She is alert and oriented to person, place, and time. Mental status is at baseline.  Psychiatric:        Mood and Affect: Mood normal.        Behavior: Behavior normal.      No results found for any visits on 12/03/23.    The ASCVD Risk score (Arnett DK, et al., 2019) failed to calculate for the following reasons:   The 2019 ASCVD risk score is only valid for ages 74 to  78    Assessment & Plan:  Obesity (BMI 30-39.9) Assessment & Plan: Doing well on the wegovy, has lost 15 pounds since starting the medication. She is tolerating the medication well, will increase to the max dose of 2.4 mg weekly, scripts sent to pharmacy. RTC in 2 months for another weight check. Will need re-authroization in April.   Orders: -     Wegovy; Inject 2.4 mg into the skin once a week.  Dispense: 3 mL; Refill: 5     Return in about 9 weeks (around 02/04/2024) for weight loss.    Karie Georges, MD

## 2023-12-03 NOTE — Assessment & Plan Note (Signed)
Doing well on the wegovy, has lost 15 pounds since starting the medication. She is tolerating the medication well, will increase to the max dose of 2.4 mg weekly, scripts sent to pharmacy. RTC in 2 months for another weight check. Will need re-authroization in April.

## 2023-12-12 ENCOUNTER — Encounter: Payer: Self-pay | Admitting: Family Medicine

## 2024-01-09 ENCOUNTER — Ambulatory Visit (INDEPENDENT_AMBULATORY_CARE_PROVIDER_SITE_OTHER): Payer: Medicaid Other | Admitting: Radiology

## 2024-01-09 ENCOUNTER — Encounter: Payer: Self-pay | Admitting: Radiology

## 2024-01-09 VITALS — BP 104/62 | HR 109 | Ht 61.5 in | Wt 175.2 lb

## 2024-01-09 DIAGNOSIS — N920 Excessive and frequent menstruation with regular cycle: Secondary | ICD-10-CM | POA: Diagnosis not present

## 2024-01-09 DIAGNOSIS — N898 Other specified noninflammatory disorders of vagina: Secondary | ICD-10-CM | POA: Diagnosis not present

## 2024-01-09 LAB — PREGNANCY, URINE: Preg Test, Ur: NEGATIVE

## 2024-01-09 NOTE — Progress Notes (Signed)
 Donna Fernandez [birth year] [id number]   History:  23 y.o. G2P2 presents as a new patient, referred by PCP for menorrhagia. Complains of very heavy periods since tubal 11/2021. Soaks tampon every hour for first few days. Also noticed green vaginal discharge x's 1 month. History of chlamydia. Had Nexplanon with irregular bleeding previously, OCPs did not help stop that bleeding per pt.    Gynecologic History Patient's last menstrual period was 01/04/2024 (exact date). Period Cycle (Days): 230 Period Duration (Days): 7 Period Pattern: Regular Menstrual Flow: Heavy Menstrual Control: Tampon Dysmenorrhea: (!) Mild Dysmenorrhea Symptoms: Cramping Last Pap: 2023. Results were: normal   Obstetric History OB History  Gravida Para Term Preterm AB Living  2 2 2   2   SAB IAB Ectopic Multiple Live Births     0 2    # Outcome Date GA Lbr Len/2nd Weight Sex Type Anes PTL Lv  2 Term 12/18/21 [redacted]w[redacted]d 14:16 / 00:07 8 lb 6 oz (3.8 kg) F Vag-Spont None  LIV     Birth Comments: WNL  1 Term 04/05/18 [redacted]w[redacted]d 51:46 / 01:32 7 lb 6.5 oz (3.359 kg) F Vag-Spont EPI  LIV     Birth Comments: WNL       01/19/2023   10:11 AM 01/19/2023    7:57 AM 05/25/2015    2:25 PM  Depression screen PHQ 2/9  Decreased Interest 0 0 3  Down, Depressed, Hopeless 0 0 1  PHQ - 2 Score 0 0 4  Altered sleeping 1  3  Tired, decreased energy 1  3  Change in appetite 2  2  Feeling bad or failure about yourself  0  0  Trouble concentrating 0  2  Moving slowly or fidgety/restless 1  1  Suicidal thoughts 0  0  PHQ-9 Score 5  15  Difficult doing work/chores Not difficult at all       The following portions of the patient's history were reviewed and updated as appropriate: allergies, current medications, past family history, past medical history, past social history, past surgical history, and problem list.  ROS  Past medical history, past surgical history, family history and social history were all reviewed and  documented in the EPIC chart.  Exam:  Vitals:   01/09/24 1403  BP: 104/62  Pulse: (!) 109  SpO2: 96%  Weight: 175 lb 3.2 oz (79.5 kg)  Height: 5' 1.5" (1.562 m)   Body mass index is 32.57 kg/m.  Physical Exam Vitals and nursing note reviewed.  Constitutional:      Appearance: Normal appearance. She is obese.  Pulmonary:     Effort: Pulmonary effort is normal.  Abdominal:     General: Abdomen is flat. Bowel sounds are normal.     Palpations: Abdomen is soft.  Genitourinary:    General: Normal vulva.     Vagina: No signs of injury and foreign body. Vaginal discharge and bleeding present. No erythema, tenderness, lesions or prolapsed vaginal walls.     Cervix: Normal.     Uterus: Absent.      Adnexa: Right adnexa normal and left adnexa normal.  Neurological:     Mental Status: She is alert.  Psychiatric:        Mood and Affect: Mood normal.        Thought Content: Thought content normal.        Judgment: Judgment normal.     Raynelle Fanning, CMA present for exam  Assessment/Plan:   1. Menorrhagia with regular cycle (  Primary) - Pregnancy, urine; negative - CBC - Iron, TIBC and Ferritin Panel - US Transvaginal Non-OB; Future  2. Vaginal discharge - SureSwab Advanced Vaginitis Plus,TMA   Will contact pt with results of blood work Will discuss management after u/s  Arlie Solomons B WHNP-BC 2:26 PM 01/09/2024

## 2024-01-10 LAB — CBC
HCT: 39.6 % (ref 35.0–45.0)
Hemoglobin: 12.8 g/dL (ref 11.7–15.5)
MCH: 29.5 pg (ref 27.0–33.0)
MCHC: 32.3 g/dL (ref 32.0–36.0)
MCV: 91.2 fL (ref 80.0–100.0)
MPV: 11 fL (ref 7.5–12.5)
Platelets: 324 10*3/uL (ref 140–400)
RBC: 4.34 10*6/uL (ref 3.80–5.10)
RDW: 12 % (ref 11.0–15.0)
WBC: 8.9 10*3/uL (ref 3.8–10.8)

## 2024-01-10 LAB — SURESWAB® ADVANCED VAGINITIS PLUS,TMA
C. trachomatis RNA, TMA: NOT DETECTED
CANDIDA SPECIES: NOT DETECTED
Candida glabrata: NOT DETECTED
N. gonorrhoeae RNA, TMA: NOT DETECTED
SURESWAB(R) ADV BACTERIAL VAGINOSIS(BV),TMA: NEGATIVE
TRICHOMONAS VAGINALIS (TV),TMA: NOT DETECTED

## 2024-01-10 LAB — IRON,TIBC AND FERRITIN PANEL
%SAT: 8 % — ABNORMAL LOW (ref 16–45)
Ferritin: 19 ng/mL (ref 16–154)
Iron: 22 ug/dL — ABNORMAL LOW (ref 40–190)
TIBC: 267 ug/dL (ref 250–450)

## 2024-01-24 ENCOUNTER — Ambulatory Visit (INDEPENDENT_AMBULATORY_CARE_PROVIDER_SITE_OTHER)

## 2024-01-24 ENCOUNTER — Ambulatory Visit (INDEPENDENT_AMBULATORY_CARE_PROVIDER_SITE_OTHER): Admitting: Radiology

## 2024-01-24 ENCOUNTER — Encounter: Payer: Self-pay | Admitting: Radiology

## 2024-01-24 VITALS — BP 116/78 | HR 85 | Ht 62.0 in | Wt 171.0 lb

## 2024-01-24 DIAGNOSIS — N920 Excessive and frequent menstruation with regular cycle: Secondary | ICD-10-CM | POA: Diagnosis not present

## 2024-01-24 MED ORDER — TRANEXAMIC ACID 650 MG PO TABS: 1300.0000 mg | ORAL_TABLET | Freq: Three times a day (TID) | ORAL | 2 refills | Status: AC

## 2024-01-24 NOTE — Progress Notes (Signed)
   Donna Fernandez February 20, 2001 [id number]   History:  23 y.o. G2P2 presents for for follow up u/s after presenting with c/o menorrhagia.  Gynecologic History Patient's last menstrual period was 01/04/2024 (exact date). Period Cycle (Days): 28 Period Duration (Days): 7 Period Pattern: Regular Menstrual Flow: Heavy Menstrual Control: Tampon, Maxi pad Dysmenorrhea: (!) Severe Dysmenorrhea Symptoms: Cramping Contraception/Family planning: tubal ligation Sexually active: yes Last JYN:WGNFAO  Obstetric History OB History  Gravida Para Term Preterm AB Living  2 2 2   2   SAB IAB Ectopic Multiple Live Births     0 2    # Outcome Date GA Lbr Len/2nd Weight Sex Type Anes PTL Lv  2 Term 12/18/21 [redacted]w[redacted]d 14:16 / 00:07 8 lb 6 oz (3.8 kg) F Vag-Spont None  LIV     Birth Comments: WNL  1 Term 04/05/18 [redacted]w[redacted]d 51:46 / 01:32 7 lb 6.5 oz (3.359 kg) F Vag-Spont EPI  LIV     Birth Comments: WNL       01/19/2023   10:11 AM 01/19/2023    7:57 AM 05/25/2015    2:25 PM  Depression screen PHQ 2/9  Decreased Interest 0 0 3  Down, Depressed, Hopeless 0 0 1  PHQ - 2 Score 0 0 4  Altered sleeping 1  3  Tired, decreased energy 1  3  Change in appetite 2  2  Feeling bad or failure about yourself  0  0  Trouble concentrating 0  2  Moving slowly or fidgety/restless 1  1  Suicidal thoughts 0  0  PHQ-9 Score 5  15  Difficult doing work/chores Not difficult at all       The following portions of the patient's history were reviewed and updated as appropriate: allergies, current medications, past family history, past medical history, past social history, past surgical history, and problem list.  Review of Systems  All other systems reviewed and are negative.   Past medical history, past surgical history, family history and social history were all reviewed and documented in the EPIC chart.  Exam:  Vitals:   01/24/24 0853  BP: 116/78  Pulse: 85  SpO2: 99%  Weight: 171 lb (77.6 kg)  Height: 5'  2" (1.575 m)   Body mass index is 31.28 kg/m.  Narrative & Impression Indication:menorrhagia   Vaginal ultrasound   Anteverted size and shape 9.30x 5.79 x 4.92cm No myometrial masses     Thin symmetrical endometrium 7.27 mm No masses or thickening seen   Both ovaries normal size with normal follicle pattern and perfusion   No adnexal masses   No free fluid   Impression:normal gyn u/s       Exam Ended: 01/24/24 08:51 Last Resulted: 01/24/24 08:58     Raynelle Fanning, CMA present for exam  Assessment/Plan:   1. Menorrhagia with regular cycle (Primary) Discussed OCPs, patch. POPs, ring, IUD, nexplanon or lysteda. Would like to try lysteda. - tranexamic acid (LYSTEDA) 650 MG TABS tablet; Take 2 tablets (1,300 mg total) by mouth 3 (three) times daily for 5 days.  Dispense: 30 tablet; Refill: 2    Return in about 3 months (around 04/25/2024) for Annual.  Tanda Rockers WHNP-BC 9:14 AM 01/24/2024

## 2024-01-31 ENCOUNTER — Ambulatory Visit: Payer: Medicaid Other | Admitting: Family Medicine

## 2024-01-31 ENCOUNTER — Encounter: Payer: Self-pay | Admitting: Family Medicine

## 2024-01-31 VITALS — BP 100/68 | HR 82 | Temp 98.9°F | Ht 62.0 in | Wt 169.6 lb

## 2024-01-31 DIAGNOSIS — E669 Obesity, unspecified: Secondary | ICD-10-CM | POA: Diagnosis not present

## 2024-01-31 NOTE — Assessment & Plan Note (Signed)
 Doing very well, she has lost a total of 43 pounds since starting the wegovy (202 starting weight). She continues on a diet and exercise regimen, she is over half way to her goal. RTC in 6 months.

## 2024-01-31 NOTE — Progress Notes (Signed)
 Established Patient Office Visit  Subjective   Patient ID: Donna Fernandez, female    DOB: July 30, 2001  Age: 23 y.o. MRN: [id number]  Chief Complaint  Patient presents with   Medical Management of Chronic Issues    Pt is here for weight loss visit, states that she just started her iron supplements so this might be causing a little constipation but otherwise she is tolerating the wegovy well.   Patient has lost 43 pounds total since starting the wegovy, her goal weight is to be around 130 pounds. Has lost 21% of her body weight so far. Patient continues to reduce carb intake, eating high protein as well. Is still exercising 2-3 times per week at the gym, is doing upper and lower body resistance machines, light weights, also with cardio 40 minutes each time.     Current Outpatient Medications  Medication Instructions   ibuprofen (ADVIL) 800 mg, Oral, Every 8 hours PRN   OVER THE COUNTER MEDICATION One supplement containing: Iron 65mg , elderberry/vitamin C/zinc/vitamin D3-once a day   Wegovy 2.4 mg, Subcutaneous, Weekly    Patient Active Problem List   Diagnosis Date Noted   Migraine without aura and without status migrainosus, not intractable 01/24/2023   Generalized anxiety disorder 01/24/2023   Menometrorrhagia 01/24/2023   Headache with neurologic deficit 01/24/2023   Unwanted fertility 12/20/2021   Rubella non-immune status, antepartum 12/19/2021   Maternal varicella, non-immune 12/19/2021   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     Objective:     BP 100/68   Pulse 82   Temp 98.9 F (37.2 C) (Oral)   Ht 5\' 2"  (1.575 m)   Wt 169 lb 9.6 oz (76.9 kg)   LMP 01/04/2024 (Exact Date)    SpO2 98%   BMI 31.02 kg/m    Physical Exam Vitals reviewed.  Constitutional:      Appearance: Normal appearance. She is obese.  Pulmonary:     Effort: Pulmonary effort is normal.  Musculoskeletal:     Right lower leg: No edema.     Left lower leg: No edema.  Neurological:     Mental Status: She is alert and oriented to person, place, and time. Mental status is at baseline.  Psychiatric:        Mood and Affect: Mood normal.        Behavior: Behavior normal.      No results found for any visits on 01/31/24.    The ASCVD Risk score (Arnett DK, et al., 2019) failed to calculate for the following reasons:   The 2019 ASCVD risk score is only valid for ages 71 to 27    Assessment & Plan:  Obesity (BMI 30-39.9) Assessment & Plan: Doing very well, she has lost a total of 43 pounds since starting the wegovy (202 starting weight). She continues on a diet and exercise regimen, she is over half way to her goal. RTC in 6 months.     I spent 20 minutes with the patient today reinforcing dietary restrictions. 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   Return in about 6 months (  around 08/01/2024) for annual physical exam.    Karie Georges, MD

## 2024-02-04 ENCOUNTER — Ambulatory Visit (INDEPENDENT_AMBULATORY_CARE_PROVIDER_SITE_OTHER)

## 2024-02-04 DIAGNOSIS — Z111 Encounter for screening for respiratory tuberculosis: Secondary | ICD-10-CM

## 2024-02-04 NOTE — Progress Notes (Signed)
 PPD Placement note Donna Fernandez, 23 y.o. 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 No:  Is patient taking any oral or IV steroid medication now or have they taken it in the last month? no Has the patient ever received the BCG vaccine?: no Has the patient been in recent contact with anyone known or suspected of having active TB disease?: no      Date of exposure (if applicable): n/a      Name of person they were exposed to (if applicable):  Patient's Country of origin?: [place] States O: Alert and oriented in NAD. P:  PPD placed on 02/04/2024.  Patient advised to return for reading within 48-72 hours.

## 2024-02-06 ENCOUNTER — Ambulatory Visit

## 2024-02-06 LAB — TB SKIN TEST
Induration: 0 mm
TB Skin Test: NEGATIVE

## 2024-02-06 NOTE — Progress Notes (Signed)
 PPD Reading Note  PPD read and results entered in EpicCare.  Result: 0 mm induration.  Interpretation: negative  If test not read within 48-72 hours of initial placement, patient advised to repeat in other arm 1-3 weeks after this test.  Allergic reaction: no

## 2024-02-08 ENCOUNTER — Telehealth: Payer: Self-pay | Admitting: *Deleted

## 2024-02-08 DIAGNOSIS — Z111 Encounter for screening for respiratory tuberculosis: Secondary | ICD-10-CM

## 2024-02-08 NOTE — Telephone Encounter (Signed)
 Copied from CRM [id number]. Topic: General - Other >> Feb 08, 2024  3:58 PM Rodman Pickle T wrote: Reason for CRM: patient is calling regarding a order to get the tb blood drawn done

## 2024-02-10 NOTE — Telephone Encounter (Signed)
 Ok to place order for quantiferon gold test -- use dx TB screening

## 2024-02-11 ENCOUNTER — Encounter: Payer: Self-pay | Admitting: Family Medicine

## 2024-02-14 NOTE — Telephone Encounter (Signed)
 order placed.

## 2024-02-15 ENCOUNTER — Telehealth: Payer: Self-pay

## 2024-02-15 ENCOUNTER — Other Ambulatory Visit (HOSPITAL_COMMUNITY): Payer: Self-pay

## 2024-02-15 NOTE — Telephone Encounter (Signed)
 Pharmacy Patient Advocate Encounter   Received notification from Physician's Office that prior authorization for Wegovy  2.4MG /0.75ML auto-injectors is required/requested.   Insurance verification completed.   The patient is insured through [facility] MEDICAID.   Per test claim: PA required; PA submitted to above mentioned insurance via CoverMyMeds Key/confirmation #/EOC (Key: [id number])  Status is pending

## 2024-02-15 NOTE — Telephone Encounter (Signed)
 Pharmacy Patient Advocate Encounter   Received notification from CoverMyMeds that prior authorization for Wegovy  2.4 is required/requested.   Insurance verification completed.   The patient is insured through [facility] .   Per test claim: Patient must be at least 24 years old.  Key: [employer]

## 2024-02-18 ENCOUNTER — Other Ambulatory Visit (HOSPITAL_COMMUNITY): Payer: Self-pay

## 2024-02-18 NOTE — Telephone Encounter (Signed)
 Pharmacy Patient Advocate Encounter  Received notification from [employer]. that Prior Authorization for Wegovy  2.4MG /0.75ML auto-injectors has been APPROVED  to 4.18.26. Ran test claim, Copay is $4.00. This test claim was processed through [facility]- copay amounts may vary at other pharmacies due to pharmacy/plan contracts, or as the patient moves through the different stages of their insurance plan.   PA #/Case ID/Reference #: (Key: [id number])

## 2024-02-25 ENCOUNTER — Encounter (HOSPITAL_COMMUNITY): Payer: Self-pay

## 2024-02-25 ENCOUNTER — Ambulatory Visit (HOSPITAL_COMMUNITY)
Admission: RE | Admit: 2024-02-25 | Discharge: 2024-02-25 | Disposition: A | Discharge status: Home / Self Care | Payer: Self-pay | Source: Ambulatory Visit | Attending: Physician Assistant | Admitting: Physician Assistant

## 2024-02-25 ENCOUNTER — Other Ambulatory Visit: Payer: Self-pay

## 2024-02-25 VITALS — BP 97/63 | HR 78 | Temp 99.0°F | Resp 18

## 2024-02-25 DIAGNOSIS — R208 Other disturbances of skin sensation: Secondary | ICD-10-CM | POA: Diagnosis present

## 2024-02-25 LAB — CBC WITH DIFFERENTIAL/PLATELET
Abs Immature Granulocytes: 0.02 10*3/uL (ref 0.00–0.07)
Basophils Absolute: 0 10*3/uL (ref 0.0–0.1)
Basophils Relative: 0 %
Eosinophils Absolute: 0.1 10*3/uL (ref 0.0–0.5)
Eosinophils Relative: 2 %
HCT: 40.1 % (ref 36.0–46.0)
Hemoglobin: 13.2 g/dL (ref 12.0–15.0)
Immature Granulocytes: 0 %
Lymphocytes Relative: 28 %
Lymphs Abs: 2 10*3/uL (ref 0.7–4.0)
MCH: 30.6 pg (ref 26.0–34.0)
MCHC: 32.9 g/dL (ref 30.0–36.0)
MCV: 93 fL (ref 80.0–100.0)
Monocytes Absolute: 0.5 10*3/uL (ref 0.1–1.0)
Monocytes Relative: 7 %
Neutro Abs: 4.4 10*3/uL (ref 1.7–7.7)
Neutrophils Relative %: 63 %
Platelets: 310 10*3/uL (ref 150–400)
RBC: 4.31 MIL/uL (ref 3.87–5.11)
RDW: 12.5 % (ref 11.5–15.5)
WBC: 7.1 10*3/uL (ref 4.0–10.5)
nRBC: 0 % (ref 0.0–0.2)

## 2024-02-25 LAB — COMPREHENSIVE METABOLIC PANEL WITH GFR
ALT: 22 U/L (ref 0–44)
AST: 24 U/L (ref 15–41)
Albumin: 3.9 g/dL (ref 3.5–5.0)
Alkaline Phosphatase: 55 U/L (ref 38–126)
Anion gap: 9 (ref 5–15)
BUN: 8 mg/dL (ref 6–20)
CO2: 25 mmol/L (ref 22–32)
Calcium: 8.8 mg/dL — ABNORMAL LOW (ref 8.9–10.3)
Chloride: 104 mmol/L (ref 98–111)
Creatinine, Ser: 0.63 mg/dL (ref 0.44–1.00)
GFR, Estimated: >60 mL/min (ref >60)
Glucose, Bld: 83 mg/dL (ref 70–99)
Potassium: 4.2 mmol/L (ref 3.5–5.1)
Sodium: 138 mmol/L (ref 135–145)
Total Bilirubin: 0.6 mg/dL (ref 0.0–1.2)
Total Protein: 7.1 g/dL (ref 6.5–8.1)

## 2024-02-25 LAB — TSH: TSH: 1.478 u[IU]/mL (ref 0.350–4.500)

## 2024-02-25 LAB — VITAMIN B12: Vitamin B-12: 419 pg/mL (ref 180–914)

## 2024-02-25 MED ORDER — GABAPENTIN 100 MG PO CAPS
100.0000 mg | ORAL_CAPSULE | Freq: Every day | ORAL | 0 refills | Status: AC
Start: 2024-02-25 — End: ?

## 2024-02-25 NOTE — ED Triage Notes (Signed)
 Burning sensation in right lower leg.  No rash present.  Clothing touching this area or a blanket at night causes a burning sensation.  Also described as stinging.  Patient intermittently reports a similar sensation in below shoulder blades, burning sensation.  Has taken ibuprofen , but no relief

## 2024-02-25 NOTE — Discharge Instructions (Addendum)
 Gabapentin at night.  This will make you sleepy so do not drive or drink alcohol with taking it.  I will contact you if any of your blood work is abnormal and changes our treatment plan.  As we discussed, it is possible that your Wegovy  is contributing to the symptoms.  If all of your blood work is abnormal and you continue to have symptoms I recommend you follow-up with your primary care for further evaluation.  If anything worsens and you have widespread pain, associated rash, swelling/redness of your lower leg, fever, weakness you need to be seen immediately.

## 2024-02-25 NOTE — ED Provider Notes (Signed)
 [facility]    CSN: [id number] Arrival date & time: 02/25/24  1737      History   Chief Complaint Chief Complaint  Patient presents with   Appointment    6:00   skin burning    HPI Donna Fernandez is a 23 y.o. female.   Patient presents today with a 2 to 3-day history of burning sensation and pain in her lateral right lower extremity.  She reports that the primary source of sensation is her lateral calf but she does have a similar sensation at her hip and in place in her back.  She denies any recent back injury or change in activity.  She has tried ibuprofen  without improvement of symptoms.  She reports this is gradually been worsening and even loosefitting clothing or having shoes on her leg at night is painful and prevents her from sleeping.  She does have a history of anemia but is unsure if she has low vitamin B12.  She denies any recent medication changes.  Denies history of neurological condition.  She denies a history of malignancy, diabetes, heavy metal exposure, chemotherapy use.  She is confident that she is not pregnant as she had a tubal ligation.  She does take Wegovy  and has been stable on this medication for several months.  Denies any recent medication adjustment.    Past Medical History:  Diagnosis Date   Anemia    Medical history non-contributory    Otitis media, recurrent     Patient Active Problem List   Diagnosis Date Noted   Migraine without aura and without status migrainosus, not intractable 01/24/2023   Generalized anxiety disorder 01/24/2023   Menometrorrhagia 01/24/2023   Headache with neurologic deficit 01/24/2023   Unwanted fertility 12/20/2021   Rubella non-immune status, antepartum 12/19/2021   Maternal varicella, non-immune 12/19/2021   Hearing loss of left ear 11/14/2018   SVD (spontaneous vaginal delivery) 04/05/2018   Preterm contractions 03/07/2018   Obesity (BMI 30-39.9) 06/03/2015   Myopia 09/02/2014   BMI (body mass  index), pediatric, 85% to less than 95% for age 2/30/2015    Past Surgical History:  Procedure Laterality Date   ADENOIDECTOMY     APPENDECTOMY     LAPAROSCOPIC APPENDECTOMY N/A 07/29/2013   Procedure: APPENDECTOMY LAPAROSCOPIC;  Surgeon: Melven Stable. Alanda Allegra, MD;  Location: [facility] OR;  Service: Pediatrics;  Laterality: N/A;   TUBAL LIGATION N/A 12/20/2021   Procedure: POST PARTUM TUBAL LIGATION;  Surgeon: Malka Sea, DO;  Location: [facility];  Service: Obstetrics;  Laterality: N/A;   TYMPANOSTOMY TUBE PLACEMENT      OB History     Gravida  2   Para  2   Term  2   Preterm      AB      Living  2      SAB      IAB      Ectopic      Multiple  0   Live Births  2            Home Medications    Prior to Admission medications   Medication Sig Start Date End Date Taking? Authorizing Provider  gabapentin (NEURONTIN) 100 MG capsule Take 1 capsule (100 mg total) by mouth at bedtime. 02/25/24  Yes Mitzie Marlar K, PA-C  ibuprofen  (ADVIL ) 800 MG tablet Take 1 tablet (800 mg total) by mouth every 8 (eight) hours as needed. 01/19/23   Aida House, MD  Semaglutide -Weight Management (  WEGOVY ) 2.4 MG/0.75ML SOAJ Inject 2.4 mg into the skin once a week. 12/03/23   Aida House, MD  Norethindrone  Acetate-Ethinyl Estradiol (JUNEL 1.5/30) 1.5-30 MG-MCG tablet Take 1 tablet by mouth daily. 05/20/20 02/15/21  Marijean Shouts, NP    Family History Family History  Problem Relation Age of Onset   Asthma Mother    Diabetes type II Maternal Aunt    Diabetes type II Maternal Uncle    Endometrial cancer Maternal Grandmother     Social History Social History   Tobacco Use   Smoking status: Every Day    Current packs/day: 0.00    Types: Cigarettes, E-cigarettes    Last attempt to quit: 09/2023    Years since quitting: 0.4   Smokeless tobacco: Current   Tobacco comments:    Vapes  Vaping Use   Vaping status: Every Day  Substance Use Topics   Alcohol use: Not  Currently   Drug use: No     Allergies   Patient has no known allergies.   Review of Systems Review of Systems  Constitutional:  Positive for activity change. Negative for appetite change, fatigue and fever.  Respiratory:  Negative for shortness of breath.   Cardiovascular:  Negative for chest pain.  Gastrointestinal:  Negative for abdominal pain, diarrhea, nausea and vomiting.  Musculoskeletal:  Positive for myalgias. Negative for arthralgias and back pain.  Neurological:  Negative for weakness and numbness.     Physical Exam Triage Vital Signs ED Triage Vitals  Encounter Vitals Group     BP 02/25/24 1827 97/63     Systolic BP Percentile --      Diastolic BP Percentile --      Pulse Rate 02/25/24 1827 78     Resp 02/25/24 1827 18     Temp 02/25/24 1827 99 F (37.2 C)     Temp Source 02/25/24 1827 Oral     SpO2 02/25/24 1827 98 %     Weight --      Height --      Head Circumference --      Peak Flow --      Pain Score 02/25/24 1824 8     Pain Loc --      Pain Education --      Exclude from Growth Chart --    No data found.  Updated Vital Signs BP 97/63 (BP Location: Right Arm)   Pulse 78   Temp 99 F (37.2 C) (Oral)   Resp 18   LMP 02/04/2024   SpO2 98%   Visual Acuity Right Eye Distance:   Left Eye Distance:   Bilateral Distance:    Right Eye Near:   Left Eye Near:    Bilateral Near:     Physical Exam Vitals reviewed.  Constitutional:      General: She is awake. She is not in acute distress.    Appearance: Normal appearance. She is well-developed. She is not ill-appearing.     Comments: Very pleasant female appears stated age in no acute distress sitting comfortably in exam room  HENT:     Head: Normocephalic and atraumatic.  Cardiovascular:     Rate and Rhythm: Normal rate and regular rhythm.     Heart sounds: Normal heart sounds, S1 normal and S2 normal. No murmur heard. Pulmonary:     Effort: Pulmonary effort is normal.     Breath sounds:  Normal breath sounds. No wheezing, rhonchi or rales.     Comments: Clear  to auscultation bilaterally Abdominal:     Palpations: Abdomen is soft.     Tenderness: There is no abdominal tenderness.  Musculoskeletal:     Cervical back: No tenderness or bony tenderness.     Thoracic back: No tenderness or bony tenderness.     Lumbar back: Tenderness present. No bony tenderness.     Comments: Back: No pain percussion of vertebrae.  No deformity or step-off noted.  Normal active range of motion.  Mild tenderness palpation of lower thoracic/upper lumbar paraspinal muscles on the right side.  Right leg: Normal active range of motion at hip and knee.  Mild tenderness palpation with associated burning sensation of the lateral right lower leg.  No deformity noted.  Negative Homans' sign.  No rash.  Psychiatric:        Behavior: Behavior is cooperative.      UC Treatments / Results  Labs (all labs ordered are listed, but only abnormal results are displayed) Labs Reviewed  CBC WITH DIFFERENTIAL/PLATELET  COMPREHENSIVE METABOLIC PANEL WITH GFR  TSH  VITAMIN B12    EKG   Radiology No results found.  Procedures Procedures (including critical care time)  Medications Ordered in UC Medications - No data to display  Initial Impression / Assessment and Plan / UC Course  I have reviewed the triage vital signs and the nursing notes.  Pertinent labs & imaging results that were available during my care of the patient were reviewed by me and considered in my medical decision making (see chart for details).     Patient is well-appearing, afebrile, nontoxic, nontachycardic.  Unclear etiology of symptoms.  Low suspicion for VTE event given she has no significant erythema or leg swelling with tenderness and reproducible burning sensation of multiple discrete areas including back.  Will obtain basic blood work to investigate potential causes including CBC, CMP, TSH, vitamin B12.  We will contact her  if this is abnormal and changes her treatment plan.  We did discuss that there is a 2% reported association of abnormal sensation including burning sensation with the use of semaglutide  insert is possible that this is related to her medication.  If her lab work is abnormal and she continues to have symptoms I did recommend that she follow-up with her primary care to discuss this medication.  Will treat symptomatically with gabapentin we discussed that this can be sedating.  She is not to drive or drink alcohol with taking it.  We discussed that this is symptomatic care only and does not address the underlying issue as it is important that she follows up with her PCP if symptoms do not resolve.  If she has any worsening or changing symptoms she needs to be seen immediately.  Strict return precautions given.  Excuse note provided.  Final Clinical Impressions(s) / UC Diagnoses   Final diagnoses:  Burning sensation of lower extremity  Burning sensation of skin     Discharge Instructions      Gabapentin at night.  This will make you sleepy so do not drive or drink alcohol with taking it.  I will contact you if any of your blood work is abnormal and changes our treatment plan.  As we discussed, it is possible that your Wegovy  is contributing to the symptoms.  If all of your blood work is abnormal and you continue to have symptoms I recommend you follow-up with your primary care for further evaluation.  If anything worsens and you have widespread pain, associated rash, swelling/redness of  your lower leg, fever, weakness you need to be seen immediately.     ED Prescriptions     Medication Sig Dispense Auth. Provider   gabapentin (NEURONTIN) 100 MG capsule Take 1 capsule (100 mg total) by mouth at bedtime. 14 capsule Tonantzin Mimnaugh K, PA-C      PDMP not reviewed this encounter.   Budd Cargo, PA-C 02/25/24 1900

## 2024-02-26 ENCOUNTER — Encounter (HOSPITAL_COMMUNITY): Payer: Self-pay | Admitting: Physician Assistant

## 2024-03-31 ENCOUNTER — Encounter: Payer: Self-pay | Admitting: Family Medicine

## 2024-03-31 DIAGNOSIS — R110 Nausea: Secondary | ICD-10-CM

## 2024-04-01 MED ORDER — ONDANSETRON HCL 4 MG PO TABS: 4.0000 mg | ORAL_TABLET | Freq: Three times a day (TID) | ORAL | 0 refills | Status: AC | PRN

## 2024-05-12 ENCOUNTER — Emergency Department (HOSPITAL_COMMUNITY)
Admission: EM | Admit: 2024-05-12 | Discharge: 2024-05-12 | Discharge status: Against Medical Advice | Attending: Emergency Medicine | Admitting: Emergency Medicine

## 2024-05-12 ENCOUNTER — Other Ambulatory Visit: Payer: Self-pay

## 2024-05-12 ENCOUNTER — Encounter (HOSPITAL_COMMUNITY): Payer: Self-pay | Admitting: *Deleted

## 2024-05-12 DIAGNOSIS — Y92410 Unspecified street and highway as the place of occurrence of the external cause: Secondary | ICD-10-CM | POA: Diagnosis not present

## 2024-05-12 DIAGNOSIS — Z5321 Procedure and treatment not carried out due to patient leaving prior to being seen by health care provider: Secondary | ICD-10-CM | POA: Insufficient documentation

## 2024-05-12 DIAGNOSIS — M545 Low back pain, unspecified: Secondary | ICD-10-CM | POA: Insufficient documentation
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
## 2024-05-12 NOTE — ED Notes (Signed)
 Pt left AMA for various reasons

## 2024-05-19 ENCOUNTER — Encounter: Payer: Self-pay | Admitting: Family Medicine

## 2024-05-19 DIAGNOSIS — E669 Obesity, unspecified: Secondary | ICD-10-CM

## 2024-05-19 MED ORDER — WEGOVY 2.4 MG/0.75ML ~~LOC~~ SOAJ: 2.4000 mg | SUBCUTANEOUS | 5 refills | Status: DC

## 2024-07-16 ENCOUNTER — Encounter: Payer: Self-pay | Admitting: Family Medicine

## 2024-07-18 ENCOUNTER — Ambulatory Visit (HOSPITAL_COMMUNITY)
Admission: RE | Admit: 2024-07-18 | Discharge: 2024-07-18 | Disposition: A | Discharge status: Home / Self Care | Source: Ambulatory Visit | Attending: Internal Medicine | Admitting: Internal Medicine

## 2024-07-18 ENCOUNTER — Encounter (HOSPITAL_COMMUNITY): Payer: Self-pay

## 2024-07-18 ENCOUNTER — Ambulatory Visit (INDEPENDENT_AMBULATORY_CARE_PROVIDER_SITE_OTHER)

## 2024-07-18 VITALS — BP 104/71 | HR 79 | Temp 98.3°F | Resp 15

## 2024-07-18 DIAGNOSIS — M25571 Pain in right ankle and joints of right foot: Secondary | ICD-10-CM

## 2024-07-18 DIAGNOSIS — S93401A Sprain of unspecified ligament of right ankle, initial encounter: Secondary | ICD-10-CM | POA: Diagnosis not present

## 2024-07-18 NOTE — ED Provider Notes (Signed)
 [facility]    CSN: [id number] Arrival date & time: 07/18/24  1530      History   Chief Complaint Chief Complaint  Patient presents with   appt 4    HPI Donna Fernandez is a 23 y.o. female.   23 y.o. female who presents to urgent care with complaints of right ankle pain.  She reports this morning that she twisted her ankle on a stair rolling her ankle outwards.  Unfortunately she had clinicals today and was unable to stay off of it.  She has had pain just standing on it during the day.  It hurts more with full extension.  She also had to drive a long distance for clinicals causing more pain.  She has a history of injuring this ankle in the past.     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 Julietta Millman, MD;  Location: [facility];  Service: Pediatrics;  Laterality: N/A;   TUBAL LIGATION N/A 12/20/2021   Procedure: POST PARTUM TUBAL LIGATION;  Surgeon: Barbra Lang PARAS, DO;  Location: [facility];  Service: Obstetrics;  Laterality: N/A;   TYMPANOSTOMY TUBE PLACEMENT      OB  History     Gravida  2   Para  2   Term  2   Preterm      AB      Living  2      SAB      IAB      Ectopic      Multiple  0   Live Births  2            Home Medications    Prior to Admission medications   Medication Sig Start Date End Date Taking? Authorizing Provider  gabapentin  (NEURONTIN ) 100 MG capsule Take 1 capsule (100 mg total) by mouth at bedtime. 02/25/24   Raspet, Erin K, PA-C  ibuprofen  (ADVIL ) 800 MG tablet Take 1 tablet (800 mg total) by mouth every 8 (eight) hours as needed. 01/19/23   Ozell Heron CHRISTELLA, MD  ondansetron  (ZOFRAN ) 4 MG tablet Take 1 tablet (4 mg total) by mouth every 8 (eight) hours as needed for nausea or vomiting. 04/01/24   Ozell Heron CHRISTELLA, MD  Semaglutide -Weight Management (WEGOVY ) 2.4 MG/0.75ML SOAJ Inject 2.4 mg into the skin once a week. 05/19/24   Ozell Heron CHRISTELLA, MD  Norethindrone  Acetate-Ethinyl Estradiol (JUNEL 1.5/30) 1.5-30 MG-MCG tablet Take 1 tablet by mouth daily. 05/20/20 02/15/21  Joshua Bari CHRISTELLA, NP    Family History Family History  Problem Relation Age of Onset   Asthma Mother  Diabetes type II Maternal Aunt    Diabetes type II Maternal Uncle    Endometrial cancer Maternal Grandmother     Social History Social History   Tobacco Use   Smoking status: Every Day    Current packs/day: 0.00    Types: Cigarettes, E-cigarettes    Last attempt to quit: 09/2023    Years since quitting: 0.8   Smokeless tobacco: Current   Tobacco comments:    Vapes  Vaping Use   Vaping status: Every Day  Substance Use Topics   Alcohol use: Not Currently   Drug use: No     Allergies   Patient has no known allergies.   Review of Systems Review of Systems  Constitutional:  Negative for chills and fever.  HENT:  Negative for ear pain and sore throat.   Eyes:  Negative for pain and visual disturbance.  Respiratory:  Negative for cough and shortness of breath.   Cardiovascular:  Negative for chest pain and  palpitations.  Gastrointestinal:  Negative for abdominal pain and vomiting.  Genitourinary:  Negative for dysuria and hematuria.  Musculoskeletal:  Negative for arthralgias and back pain.       Right ankle pain and difficulty standing  Skin:  Negative for color change and rash.  Neurological:  Negative for seizures and syncope.  All other systems reviewed and are negative.    Physical Exam Triage Vital Signs ED Triage Vitals  Encounter Vitals Group     BP 07/18/24 1642 104/71     Girls Systolic BP Percentile --      Girls Diastolic BP Percentile --      Boys Systolic BP Percentile --      Boys Diastolic BP Percentile --      Pulse Rate 07/18/24 1642 79     Resp 07/18/24 1642 15     Temp 07/18/24 1642 98.3 F (36.8 C)     Temp Source 07/18/24 1642 Oral     SpO2 07/18/24 1642 98 %     Weight --      Height --      Head Circumference --      Peak Flow --      Pain Score 07/18/24 1641 8     Pain Loc --      Pain Education --      Exclude from Growth Chart --    No data found.  Updated Vital Signs BP 104/71 (BP Location: Left Arm)   Pulse 79   Temp 98.3 F (36.8 C) (Oral)   Resp 15   LMP 06/30/2024 (Exact Date)   SpO2 98%   Visual Acuity Right Eye Distance:   Left Eye Distance:   Bilateral Distance:    Right Eye Near:   Left Eye Near:    Bilateral Near:     Physical Exam Vitals and nursing note reviewed.  Constitutional:      General: She is not in acute distress.    Appearance: She is well-developed.  HENT:     Head: Normocephalic and atraumatic.  Eyes:     Conjunctiva/sclera: Conjunctivae normal.  Cardiovascular:     Rate and Rhythm: Normal rate and regular rhythm.     Heart sounds: No murmur heard. Pulmonary:     Effort: Pulmonary effort is normal. No respiratory distress.     Breath sounds: Normal breath sounds.  Abdominal:     Palpations: Abdomen is soft.     Tenderness: There is no abdominal tenderness.  Musculoskeletal:  General: No  swelling.     Cervical back: Neck supple.     Right ankle: Swelling present. No deformity or ecchymosis. Tenderness present over the lateral malleolus. Decreased range of motion. Normal pulse.     Right Achilles Tendon: Normal.  Skin:    General: Skin is warm and dry.     Capillary Refill: Capillary refill takes less than 2 seconds.  Neurological:     Mental Status: She is alert.  Psychiatric:        Mood and Affect: Mood normal.      UC Treatments / Results  Labs (all labs ordered are listed, but only abnormal results are displayed) Labs Reviewed - No data to display  EKG   Radiology DG Ankle Complete Right Result Date: 07/18/2024 CLINICAL DATA:  Twisting injury, pain over lateral malleolus EXAM: RIGHT ANKLE - COMPLETE 3+ VIEW COMPARISON:  12/30/2020 FINDINGS: Frontal, oblique, and lateral views of the right ankle are obtained. No acute fracture, subluxation, or dislocation. Joint spaces are well preserved. Soft tissues are unremarkable. IMPRESSION: 1. Unremarkable right ankle.  No acute displaced fracture. Electronically Signed   By: Ozell Daring M.D.   On: 07/18/2024 17:14    Procedures Procedures (including critical care time)  Medications Ordered in UC Medications - No data to display  Initial Impression / Assessment and Plan / UC Course  I have reviewed the triage vital signs and the nursing notes.  Pertinent labs & imaging results that were available during my care of the patient were reviewed by me and considered in my medical decision making (see chart for details).     Acute right ankle pain - Plan: DG Ankle Complete Right, DG Ankle Complete Right  Sprain of right ankle, unspecified ligament, initial encounter   X-ray of the right ankle done today.  Final evaluation by the radiologist shows no acute findings.  Symptoms and physical exam findings are most consistent with a mild sprain to the right ankle.  Recommend the following: Ice the area 2-3 times daily  for 10-15 minutes to help with pain and swelling. Do not apply ice directly to the skin.  May alternate ibuprofen  and Tylenol  to help with pain Wear support brace during the day when active. Remove at night. Remove if numbness, tingling or increased pain occurs. Do this for 5-7 days then may discontinue and increase activity as tolerated.  If symptoms fail to improve, recommend following up with orthopedics  Final Clinical Impressions(s) / UC Diagnoses   Final diagnoses:  Acute right ankle pain  Sprain of right ankle, unspecified ligament, initial encounter     Discharge Instructions      X-ray of the right ankle done today.  Final evaluation by the radiologist shows no acute findings.  Symptoms and physical exam findings are most consistent with a mild sprain to the right ankle.  Recommend the following: Ice the area 2-3 times daily for 10-15 minutes to help with pain and swelling. Do not apply ice directly to the skin.  May alternate ibuprofen  and Tylenol  to help with pain Wear support brace during the day when active. Remove at night. Remove if numbness, tingling or increased pain occurs. Do this for 5-7 days then may discontinue and increase activity as tolerated.  If symptoms fail to improve, recommend following up with orthopedics     ED Prescriptions   None    PDMP not reviewed this encounter.   Teresa Almarie LABOR, PA-C 07/18/24 1723

## 2024-07-18 NOTE — ED Triage Notes (Signed)
 Pt reports this morning missed a step and fell going down stairs. Pt twist her right ankle. Pt worse with weight bearing. Took ibuprofen  800 mg that didn't help.

## 2024-07-18 NOTE — Discharge Instructions (Addendum)
 X-ray of the right ankle done today.  Final evaluation by the radiologist shows no acute findings.  Symptoms and physical exam findings are most consistent with a mild sprain to the right ankle.  Recommend the following: Ice the area 2-3 times daily for 10-15 minutes to help with pain and swelling. Do not apply ice directly to the skin.  May alternate ibuprofen  and Tylenol  to help with pain Wear support brace during the day when active. Remove at night. Remove if numbness, tingling or increased pain occurs. Do this for 5-7 days then may discontinue and increase activity as tolerated.  If symptoms fail to improve, recommend following up with orthopedics

## 2024-07-23 ENCOUNTER — Encounter: Payer: Self-pay | Admitting: Family Medicine

## 2024-07-23 ENCOUNTER — Telehealth: Payer: Self-pay | Admitting: *Deleted

## 2024-07-23 NOTE — Telephone Encounter (Signed)
 Copied from CRM [id number]. Topic: General - Other >> Jul 23, 2024  4:48 PM Chiquita SQUIBB wrote: Reason for CRM: [facility] is calling in stating they received a letter for this patient mixed in with another patients referral. They have removed it from that patients referral but would like to make the office aware.

## 2024-07-23 NOTE — Telephone Encounter (Signed)
 Ok to write letter

## 2024-07-28 NOTE — Telephone Encounter (Signed)
 Noted

## 2024-07-28 NOTE — Telephone Encounter (Signed)
 I think that was her letter-- do I need to sign another copy?

## 2024-07-30 NOTE — Telephone Encounter (Signed)
 Re-printed and placed in the red folder.

## 2024-07-30 NOTE — Telephone Encounter (Signed)
 Can you get me another copy of the letter?

## 2024-07-31 ENCOUNTER — Encounter: Admitting: Family Medicine

## 2024-08-01 ENCOUNTER — Encounter: Admitting: Family Medicine

## 2024-08-14 ENCOUNTER — Other Ambulatory Visit (HOSPITAL_COMMUNITY)
Admission: RE | Admit: 2024-08-14 | Discharge: 2024-08-14 | Disposition: A | Discharge status: Home / Self Care | Source: Ambulatory Visit | Attending: Family Medicine | Admitting: Family Medicine

## 2024-08-14 ENCOUNTER — Encounter: Payer: Self-pay | Admitting: Family Medicine

## 2024-08-14 ENCOUNTER — Ambulatory Visit (INDEPENDENT_AMBULATORY_CARE_PROVIDER_SITE_OTHER): Admitting: Family Medicine

## 2024-08-14 VITALS — BP 110/70 | HR 105 | Temp 98.3°F | Ht 61.5 in | Wt 148.6 lb

## 2024-08-14 DIAGNOSIS — G43009 Migraine without aura, not intractable, without status migrainosus: Secondary | ICD-10-CM

## 2024-08-14 DIAGNOSIS — Z114 Encounter for screening for human immunodeficiency virus [HIV]: Secondary | ICD-10-CM

## 2024-08-14 DIAGNOSIS — Z13220 Encounter for screening for lipoid disorders: Secondary | ICD-10-CM

## 2024-08-14 DIAGNOSIS — F419 Anxiety disorder, unspecified: Secondary | ICD-10-CM

## 2024-08-14 DIAGNOSIS — Z1159 Encounter for screening for other viral diseases: Secondary | ICD-10-CM

## 2024-08-14 DIAGNOSIS — L700 Acne vulgaris: Secondary | ICD-10-CM

## 2024-08-14 DIAGNOSIS — E669 Obesity, unspecified: Secondary | ICD-10-CM | POA: Diagnosis not present

## 2024-08-14 DIAGNOSIS — Z202 Contact with and (suspected) exposure to infections with a predominantly sexual mode of transmission: Secondary | ICD-10-CM | POA: Insufficient documentation

## 2024-08-14 DIAGNOSIS — Z0000 Encounter for general adult medical examination without abnormal findings: Secondary | ICD-10-CM | POA: Diagnosis not present

## 2024-08-14 DIAGNOSIS — Z111 Encounter for screening for respiratory tuberculosis: Secondary | ICD-10-CM

## 2024-08-14 MED ORDER — TRETINOIN 0.05 % EX CREA: TOPICAL_CREAM | Freq: Every day | CUTANEOUS | 5 refills | Status: AC

## 2024-08-14 MED ORDER — PHENTERMINE HCL 30 MG PO CAPS: 30.0000 mg | ORAL_CAPSULE | ORAL | 0 refills | Status: DC

## 2024-08-14 MED ORDER — BUPROPION HCL ER (XL) 150 MG PO TB24: 150.0000 mg | ORAL_TABLET | Freq: Every day | ORAL | 4 refills | Status: DC

## 2024-08-14 MED ORDER — AMITRIPTYLINE HCL 10 MG PO TABS
10.0000 mg | ORAL_TABLET | Freq: Every day | ORAL | 3 refills | Status: AC
Start: 2024-08-14 — End: ?

## 2024-08-14 NOTE — Patient Instructions (Signed)
 Benzoyl peroxide -- 10% strength -- wash with this in the morning  Apply benzoyl peroxide cream in the morning followed by a light moisturizer  Evening wash will be with regular soap  Apply tretinoin cream at night before bed

## 2024-08-14 NOTE — Progress Notes (Signed)
 Complete physical exam  Patient: Donna Fernandez   DOB: 27-Jan-2001   23 y.o. Female  MRN: [id number]  Subjective:    Chief Complaint  Patient presents with   Annual Exam    Donna Fernandez is a 23 y.o. female who presents today for a complete physical exam. She reports consuming a low carb diet. Eating good veggies, fruits like berries, good sources of protein, staying away from ultraprocessed foods. Home exercise routine includes cardio and sit ups every morning. She generally feels well. She reports sleeping poorly, has difficulty falling asleep due to anxiety and stress. She does not have additional problems to discuss today.   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   Most recent fall risk assessment:     No data to display           Most recent depression screenings:    08/14/2024    3:27 PM 01/31/2024    2:54 PM  PHQ 2/9 Scores  PHQ - 2 Score 0 0  PHQ- 9 Score 6 0    Vision:Within last year and Dental: No current dental problems and Receives regular dental care  Patient Active Problem List   Diagnosis Date Noted   Acne vulgaris 08/20/2024   Anxiety 08/20/2024   Migraine without aura and without status migrainosus, not intractable 01/24/2023   Generalized anxiety disorder 01/24/2023   Menometrorrhagia 01/24/2023   Headache with neurologic deficit 01/24/2023   Unwanted fertility 12/20/2021   Rubella non-immune status, antepartum 12/19/2021   Maternal varicella, non-immune 12/19/2021   Hearing loss of left ear 11/14/2018   SVD (spontaneous vaginal delivery) 04/05/2018   Preterm contractions 03/07/2018   Obesity (BMI 30-39.9) 06/03/2015   Myopia 09/02/2014   BMI (body mass index), pediatric, 85% to less than 95% for age 25/30/2015      Patient Care Team: Ozell Heron HERO, MD as PCP - General  (Family Medicine) Ginette Shasta NOVAK, NP as Nurse Practitioner (Obstetrics and Gynecology)   Outpatient Medications Prior to Visit  Medication Sig   gabapentin  (NEURONTIN ) 100 MG capsule Take 1 capsule (100 mg total) by mouth at bedtime.   ibuprofen  (ADVIL ) 800 MG tablet Take 1 tablet (800 mg total) by mouth every 8 (eight) hours as needed.   ondansetron  (ZOFRAN ) 4 MG tablet Take 1 tablet (4 mg total) by mouth every 8 (eight) hours as needed for nausea or vomiting.   Semaglutide -Weight Management (WEGOVY ) 2.4 MG/0.75ML SOAJ Inject 2.4 mg into the skin once a week.   No facility-administered medications prior to visit.    Review of Systems  HENT:  Negative for hearing loss.   Eyes:  Negative for blurred vision.  Respiratory:  Negative for shortness of breath.   Cardiovascular:  Negative for chest pain.  Gastrointestinal: Negative.   Genitourinary: Negative.   Musculoskeletal:  Negative for back pain.  Neurological:  Negative for headaches.  Psychiatric/Behavioral:  Negative for depression.   All other systems reviewed and are negative.      Objective:     BP 110/70   Pulse (!) 105   Temp 98.3 F (36.8 C) (Oral)   Ht 5' 1.5 (1.562 m)   Wt 148 lb 9.6 oz (67.4 kg)   LMP 07/30/2024 (Exact Date)   SpO2 99%   BMI 27.62 kg/m    Physical Exam Vitals reviewed.  Constitutional:  Appearance: Normal appearance. She is well-groomed and normal weight.  HENT:     Right Ear: Tympanic membrane and ear canal normal.     Left Ear: Tympanic membrane and ear canal normal.     Mouth/Throat:     Mouth: Mucous membranes are moist.     Pharynx: No posterior oropharyngeal erythema.  Eyes:     Conjunctiva/sclera: Conjunctivae normal.  Neck:     Thyroid : No thyromegaly.  Cardiovascular:     Rate and Rhythm: Normal rate and regular rhythm.     Pulses: Normal pulses.     Heart sounds: S1 normal and S2 normal.  Pulmonary:     Effort: Pulmonary effort is normal.     Breath sounds:  Normal breath sounds and air entry.  Abdominal:     General: Abdomen is flat. Bowel sounds are normal.     Palpations: Abdomen is soft.  Musculoskeletal:     Right lower leg: No edema.     Left lower leg: No edema.  Lymphadenopathy:     Cervical: No cervical adenopathy.  Neurological:     Mental Status: She is alert and oriented to person, place, and time. Mental status is at baseline.     Gait: Gait is intact.  Psychiatric:        Mood and Affect: Mood and affect normal.        Speech: Speech normal.        Behavior: Behavior normal.        Judgment: Judgment normal.      Results for orders placed or performed in visit on 08/14/24  Lipid panel  Result Value Ref Range   Cholesterol 120 0 - 200 mg/dL   Triglycerides 57.9 0.0 - 149.0 mg/dL   HDL 52.19 >60.99 mg/dL   VLDL 8.4 0.0 - 59.9 mg/dL   LDL Cholesterol 64 0 - 99 mg/dL   Total CHOL/HDL Ratio 3    NonHDL 71.90   CMP  Result Value Ref Range   Sodium 139 135 - 145 mEq/L   Potassium 3.7 3.5 - 5.1 mEq/L   Chloride 103 96 - 112 mEq/L   CO2 28 19 - 32 mEq/L   Glucose, Bld 80 70 - 99 mg/dL   BUN 6 6 - 23 mg/dL   Creatinine, Ser 9.43 0.40 - 1.20 mg/dL   Total Bilirubin 0.7 0.2 - 1.2 mg/dL   Alkaline Phosphatase 48 39 - 117 U/L   AST 18 0 - 37 U/L   ALT 24 0 - 35 U/L   Total Protein 7.7 6.0 - 8.3 g/dL   Albumin 4.9 3.5 - 5.2 g/dL   GFR 872.05 >39.99 mL/min   Calcium 9.4 8.4 - 10.5 mg/dL  HIV Antibody (routine testing w rflx)  Result Value Ref Range   HIV FINAL INTERPRETATION HIV NEGATIVE    HIV 1&2 Ab, 4th Generation NON-REACTIVE NON-REACTIVE  Hepatitis C antibody  Result Value Ref Range   Hepatitis C Ab NON-REACTIVE NON-REACTIVE  QuantiFERON-TB Gold Plus  Result Value Ref Range   QuantiFERON-TB Gold Plus NEGATIVE NEGATIVE   NIL 0.01 IU/mL   Mitogen-NIL 6.87 IU/mL   TB1-NIL 0.00 IU/mL   TB2-NIL 0.00 IU/mL  Cervicovaginal ancillary only  Result Value Ref Range   Bacterial Vaginitis (gardnerella) Positive (A)     Candida Vaginitis Negative    Candida Glabrata Negative    Comment      Normal Reference Range Bacterial Vaginosis - Negative   Comment Normal Reference Range Candida Species -  Negative    Comment Normal Reference Range Candida Galbrata - Negative        Assessment & Plan:    Routine Health Maintenance and Physical Exam  Immunization History  Administered Date(s) Administered   DTaP 07/26/2000, 10/04/2000, 12/11/2000, 09/10/2001, 06/23/2004   H1N1 11/02/2008   HIB (PRP-OMP) 07/26/2000, 10/04/2000, 12/11/2000, 06/03/2001   HPV Quadrivalent 07/04/2012, 06/12/2013, 06/06/2014   Hepatitis A 12/14/2005, 06/28/2006   Hepatitis B 07-30-00, 07/03/2000, 12/11/2000   IPV 07/26/2000, 10/04/2000, 06/03/2001, 06/23/2004   Influenza,Quad,Nasal, Live 09/15/2014   Influenza,inj,Quad PF,6+ Mos 07/14/2016, 10/05/2018   Influenza-Unspecified 06/03/2001, 08/28/2003, 10/01/2003, 11/02/2008, 08/02/2009, 08/30/2022, 07/31/2023, 06/28/2024   MMR 06/03/2001, 06/23/2004, 04/07/2018, 12/20/2021   Meningococcal Conjugate 07/04/2012, 10/05/2018   PPD Test 11/14/2018, 02/04/2024   Pneumococcal-Unspecified 07/26/2000, 10/04/2000, 12/11/2000   Td 05/04/2011   Tdap 05/04/2011   Varicella 06/03/2001, 12/14/2005    Health Maintenance  Topic Date Due   DTaP/Tdap/Td (8 - Td or Tdap) 05/03/2021   Cervical Cancer Screening (Pap smear)  Never done   COVID-19 Vaccine (1 - 2025-26 season) Never done   CHLAMYDIA SCREENING  08/14/2025   Influenza Vaccine  Completed   Hepatitis B Vaccines 19-59 Average Risk  Completed   HPV VACCINES  Completed   Hepatitis C Screening  Completed   HIV Screening  Completed   Pneumococcal Vaccine  Aged Out   Meningococcal B Vaccine  Aged Out    Discussed health benefits of physical activity, and encouraged her to engage in regular exercise appropriate for her age and condition.  Routine adult health maintenance  Obesity (BMI 30-39.9) Assessment & Plan: Pt is losing her  coverage of Wegovy , she has met her goal weight, will switch her to phentermine to continue maintaining her weight loss. Education on this medication and dietary control given to patient.    Acne vulgaris -     Tretinoin; Apply topically at bedtime.  Dispense: 45 g; Refill: 5  Anxiety Assessment & Plan: New problem, we discussed her symptoms and I recommended starting wellbutrin 150 mg XR daily to help with mood and focus. PHQ and GAD scoring reviewed with patient.   Orders: -     buPROPion HCl ER (XL); Take 1 tablet (150 mg total) by mouth daily.  Dispense: 30 tablet; Refill: 4 -     Comprehensive metabolic panel with GFR; Future  Migraine without aura and without status migrainosus, not intractable -     Amitriptyline HCl; Take 1 tablet (10 mg total) by mouth at bedtime.  Dispense: 30 tablet; Refill: 3  Encounter for hepatitis C screening test for low risk patient -     Hepatitis C antibody; Future  Encounter for screening for HIV -     HIV Antibody (routine testing w rflx); Future  Possible exposure to STI -     Cervicovaginal ancillary only  Lipid screening -     Lipid panel; Future  Screening for tuberculosis -     QuantiFERON-TB Gold Plus  General physical exam findings are normal today. She needed refills on multiple medications that appear to be well controlled on the current regimen.  I reviewed the patient's preventative testing, immunizations, and lifestyle habits. I made appropriate recommendations and placed orders for the appropriate tests and/or vaccinations. I counseled the patient on the CDC's recommendations for healthy exercise and diet. I counseled the patient on healthy sleep habits and stress management. Handouts to reinforce the counseling were given at the conclusion of the visit.    Return in about  3 months (around 11/14/2024) for weight loss.     Heron CHRISTELLA Sharper, MD

## 2024-08-15 ENCOUNTER — Ambulatory Visit: Payer: Self-pay | Admitting: Family Medicine

## 2024-08-15 DIAGNOSIS — B9689 Other specified bacterial agents as the cause of diseases classified elsewhere: Secondary | ICD-10-CM

## 2024-08-15 LAB — COMPREHENSIVE METABOLIC PANEL WITH GFR
ALT: 24 U/L (ref 0–35)
AST: 18 U/L (ref 0–37)
Albumin: 4.9 g/dL (ref 3.5–5.2)
Alkaline Phosphatase: 48 U/L (ref 39–117)
BUN: 6 mg/dL (ref 6–23)
CO2: 28 meq/L (ref 19–32)
Calcium: 9.4 mg/dL (ref 8.4–10.5)
Chloride: 103 meq/L (ref 96–112)
Creatinine, Ser: 0.56 mg/dL (ref 0.40–1.20)
GFR: 127.94 mL/min (ref >60.00)
Glucose, Bld: 80 mg/dL (ref 70–99)
Potassium: 3.7 meq/L (ref 3.5–5.1)
Sodium: 139 meq/L (ref 135–145)
Total Bilirubin: 0.7 mg/dL (ref 0.2–1.2)
Total Protein: 7.7 g/dL (ref 6.0–8.3)

## 2024-08-15 LAB — LIPID PANEL
Cholesterol: 120 mg/dL (ref 0–200)
HDL: 47.8 mg/dL (ref >39.00)
LDL Cholesterol: 64 mg/dL (ref 0–99)
NonHDL: 71.9
Total CHOL/HDL Ratio: 3
Triglycerides: 42 mg/dL (ref 0.0–149.0)
VLDL: 8.4 mg/dL (ref 0.0–40.0)

## 2024-08-16 LAB — QUANTIFERON-TB GOLD PLUS
Mitogen-NIL: 6.87 [IU]/mL
NIL: 0.01 [IU]/mL
QuantiFERON-TB Gold Plus: NEGATIVE
TB1-NIL: 0 [IU]/mL
TB2-NIL: 0 [IU]/mL

## 2024-08-16 LAB — HIV ANTIBODY (ROUTINE TESTING W REFLEX)
HIV 1&2 Ab, 4th Generation: NONREACTIVE
HIV FINAL INTERPRETATION: NEGATIVE

## 2024-08-16 LAB — HEPATITIS C ANTIBODY: Hepatitis C Ab: NONREACTIVE

## 2024-08-18 ENCOUNTER — Encounter: Payer: Self-pay | Admitting: Family Medicine

## 2024-08-18 LAB — CERVICOVAGINAL ANCILLARY ONLY
Bacterial Vaginitis (gardnerella): POSITIVE — AB
Candida Glabrata: NEGATIVE
Candida Vaginitis: NEGATIVE
Comment: NEGATIVE
Comment: NEGATIVE
Comment: NEGATIVE

## 2024-08-18 MED ORDER — METRONIDAZOLE 0.75 % VA GEL: 1.0000 | Freq: Every day | VAGINAL | 0 refills | Status: AC

## 2024-08-20 ENCOUNTER — Other Ambulatory Visit: Payer: Self-pay | Admitting: Family Medicine

## 2024-08-20 DIAGNOSIS — E669 Obesity, unspecified: Secondary | ICD-10-CM

## 2024-08-20 DIAGNOSIS — L700 Acne vulgaris: Secondary | ICD-10-CM | POA: Insufficient documentation

## 2024-08-20 DIAGNOSIS — F419 Anxiety disorder, unspecified: Secondary | ICD-10-CM | POA: Insufficient documentation

## 2024-08-20 MED ORDER — PHENTERMINE HCL 37.5 MG PO TABS: 18.2500 mg | ORAL_TABLET | Freq: Two times a day (BID) | ORAL | 0 refills | Status: DC

## 2024-08-20 NOTE — Assessment & Plan Note (Signed)
 New problem, we discussed her symptoms and I recommended starting wellbutrin 150 mg XR daily to help with mood and focus. PHQ and GAD scoring reviewed with patient.

## 2024-08-20 NOTE — Assessment & Plan Note (Signed)
 Pt is losing her coverage of Wegovy , she has met her goal weight, will switch her to phentermine to continue maintaining her weight loss. Education on this medication and dietary control given to patient.

## 2024-09-27 ENCOUNTER — Encounter: Payer: Self-pay | Admitting: Family Medicine

## 2024-10-07 ENCOUNTER — Ambulatory Visit: Admitting: Podiatry

## 2024-10-13 ENCOUNTER — Ambulatory Visit: Admitting: Podiatry

## 2024-10-13 DIAGNOSIS — Z91199 Patient's noncompliance with other medical treatment and regimen due to unspecified reason: Secondary | ICD-10-CM

## 2024-10-13 NOTE — Progress Notes (Signed)
 No show

## 2024-11-07 ENCOUNTER — Encounter: Payer: Self-pay | Admitting: Family Medicine

## 2024-11-07 ENCOUNTER — Ambulatory Visit: Admitting: Family Medicine

## 2024-11-07 DIAGNOSIS — F419 Anxiety disorder, unspecified: Secondary | ICD-10-CM

## 2024-11-07 DIAGNOSIS — E669 Obesity, unspecified: Secondary | ICD-10-CM | POA: Diagnosis not present

## 2024-11-07 MED ORDER — PHENTERMINE HCL 37.5 MG PO TABS: 18.2500 mg | ORAL_TABLET | Freq: Two times a day (BID) | ORAL | 0 refills | Status: AC

## 2024-11-07 MED ORDER — BUPROPION HCL ER (XL) 150 MG PO TB24: 150.0000 mg | ORAL_TABLET | Freq: Every day | ORAL | 5 refills | Status: AC

## 2024-11-07 MED ORDER — TOPIRAMATE 50 MG PO TABS: ORAL_TABLET | ORAL | 5 refills | Status: AC

## 2024-11-07 NOTE — Progress Notes (Unsigned)
 "  Established Patient Office Visit  Subjective   Patient ID: Donna Fernandez, female    DOB: 27-Aug-2001  Age: 24 y.o. MRN: [id number]  Chief Complaint  Patient presents with   Medical Management of Chronic Issues    HPI Discussed the use of AI scribe software for clinical note transcription with the patient, who gave verbal consent to proceed.  History of Present Illness   Donna Fernandez is a 24 year old female who presents for a follow-up regarding phentermine  use for weight management.  Over the last three months she has gained about 13 pounds despite taking phentermine  0.5 tablet in the morning and 0.5 tablet in the afternoon, which she feels is less effective than prior treatments. She takes bupropion  and asks about adding topiramate , which she has not tried. She is frustrated that her insurance does not cover several weight loss medications and that she may need to pay cash.       Current Outpatient Medications  Medication Instructions   amitriptyline  (ELAVIL ) 10 mg, Oral, Daily at bedtime   buPROPion  (WELLBUTRIN  XL) 150 mg, Oral, Daily   gabapentin  (NEURONTIN ) 100 mg, Oral, Daily at bedtime   ibuprofen  (ADVIL ) 800 mg, Oral, Every 8 hours PRN   ondansetron  (ZOFRAN ) 4 mg, Oral, Every 8 hours PRN   phentermine  (ADIPEX-P ) 18.75 mg, Oral, 2 times daily before meals   [START ON 12/05/2024] phentermine  (ADIPEX-P ) 18.75 mg, Oral, 2 times daily before meals   [START ON 01/02/2025] phentermine  (ADIPEX-P ) 18.75 mg, Oral, 2 times daily before meals   topiramate  (TOPAMAX ) 50 MG tablet Take 0.5 tablets (25 mg total) by mouth at bedtime for 7 days, THEN 1 tablet (50 mg total) at bedtime for 7 days, THEN 1 tablet (50 mg total) 2 (two) times daily.   tretinoin  (RETIN-A ) 0.05 % cream Topical, Daily at bedtime    Patient Active Problem List   Diagnosis Date Noted   Acne vulgaris 08/20/2024   Anxiety 08/20/2024   Migraine without aura and without status migrainosus, not intractable  01/24/2023   Generalized anxiety disorder 01/24/2023   Menometrorrhagia 01/24/2023   Headache with neurologic deficit 01/24/2023   Unwanted fertility 12/20/2021   Rubella non-immune status, antepartum 12/19/2021   Maternal varicella, non-immune 12/19/2021   Hearing loss of left ear 11/14/2018   SVD (spontaneous vaginal delivery) 04/05/2018   Preterm contractions 03/07/2018   Obesity (BMI 30-39.9) 06/03/2015   Myopia 09/02/2014   BMI (body mass index), pediatric, 85% to less than 95% for age 38/30/2015     Review of Systems  All other systems reviewed and are negative.     Objective:     BP 100/68   Pulse 90   Temp 98.4 F (36.9 C) (Oral)   Ht 5' 1.5 (1.562 m)   Wt 161 lb 14.4 oz (73.4 kg)   LMP 10/08/2024 (Exact Date)   SpO2 98%   BMI 30.10 kg/m    Physical Exam Vitals reviewed.  Constitutional:      Appearance: Normal appearance. She is obese.  Pulmonary:     Effort: Pulmonary effort is normal.  Musculoskeletal:     Right lower leg: No edema.     Left lower leg: No edema.  Neurological:     Mental Status: She is alert and oriented to person, place, and time. Mental status is at baseline.  Psychiatric:        Mood and Affect: Mood normal.        Behavior: Behavior normal.  No results found for any visits on 11/07/24.    The ASCVD Risk score (Arnett DK, et al., 2019) failed to calculate for the following reasons:   The 2019 ASCVD risk score is only valid for ages 30 to 52   * - Cholesterol units were assumed    Assessment & Plan:  Obesity (BMI 30-39.9) -     Phentermine  HCl; Take 0.5 tablets (18.75 mg total) by mouth 2 (two) times daily before a meal.  Dispense: 30 tablet; Refill: 0 -     Topiramate ; Take 0.5 tablets (25 mg total) by mouth at bedtime for 7 days, THEN 1 tablet (50 mg total) at bedtime for 7 days, THEN 1 tablet (50 mg total) 2 (two) times daily.  Dispense: 60 tablet; Refill: 5 -     Phentermine  HCl; Take 0.5 tablets (18.75 mg total)  by mouth 2 (two) times daily before a meal.  Dispense: 30 tablet; Refill: 0 -     Phentermine  HCl; Take 0.5 tablets (18.75 mg total) by mouth 2 (two) times daily before a meal.  Dispense: 30 tablet; Refill: 0  Anxiety -     buPROPion  HCl ER (XL); Take 1 tablet (150 mg total) by mouth daily.  Dispense: 30 tablet; Refill: 5   Assessment and Plan    Obesity Management with phentermine  has been ineffective, resulting in a weight gain of 13 pounds over the last three months. Phentermine  is less effective compared to GLP-1 receptor agonists, which are not covered by insurance due to cost. Topiramate  is considered as an adjunct to enhance weight loss efforts. Insurance coverage issues for weight loss medications were discussed, highlighting the high cost and lack of coverage by major providers. - Continue phentermine , half tablet in the morning and half in the afternoon. - Added topiramate , starting with half a tablet once a day at bedtime, with gradual increase as tolerated. - Continue Wellbutrin  once daily. - Refilled prescriptions for phentermine  and Wellbutrin . - Scheduled follow-up in three months to assess the effectiveness of the regimen.        Return in about 3 months (around 02/05/2025).    Heron CHRISTELLA Sharper, MD "

## 2024-11-10 ENCOUNTER — Encounter: Payer: Self-pay | Admitting: Family Medicine

## 2024-11-14 ENCOUNTER — Ambulatory Visit: Admitting: Family Medicine

## 2024-11-17 ENCOUNTER — Other Ambulatory Visit (HOSPITAL_COMMUNITY)
Admission: RE | Admit: 2024-11-17 | Discharge: 2024-11-17 | Disposition: A | Discharge status: Home / Self Care | Source: Ambulatory Visit | Attending: Family Medicine | Admitting: Family Medicine

## 2024-11-17 ENCOUNTER — Ambulatory Visit (INDEPENDENT_AMBULATORY_CARE_PROVIDER_SITE_OTHER): Admitting: Family Medicine

## 2024-11-17 ENCOUNTER — Encounter: Payer: Self-pay | Admitting: Family Medicine

## 2024-11-17 VITALS — BP 104/68 | HR 90 | Temp 97.8°F | Ht 61.5 in | Wt 160.0 lb

## 2024-11-17 DIAGNOSIS — Z113 Encounter for screening for infections with a predominantly sexual mode of transmission: Secondary | ICD-10-CM | POA: Diagnosis not present

## 2024-11-17 DIAGNOSIS — R300 Dysuria: Secondary | ICD-10-CM | POA: Diagnosis not present

## 2024-11-17 LAB — POCT URINALYSIS DIPSTICK
Bilirubin, UA: NEGATIVE
Blood, UA: NEGATIVE
Glucose, UA: NEGATIVE
Ketones, UA: NEGATIVE
Leukocytes, UA: NEGATIVE
Nitrite, UA: NEGATIVE
Protein, UA: NEGATIVE
Spec Grav, UA: 1.01
Urobilinogen, UA: 0.2 U/dL
pH, UA: 7.5

## 2024-11-17 NOTE — Patient Instructions (Addendum)
-  It was a pleasure to care for you today.  -Urinalysis is negative for any acute findings.  -Ordered a self vaginal swab and labs for sexually transmitted disease. Office will call with lab results and will be available via MyChart.  -Follow up or send a MyChart message if symptoms become worse before results are available.

## 2024-11-17 NOTE — Progress Notes (Signed)
 "  Acute Office Visit   Subjective:  Patient ID: Donna Fernandez, female    DOB: Dec 07, 2000, 24 y.o.   MRN: [id number]  Chief Complaint  Patient presents with   Exposure to STD   Dysuria    HPI:  Patient is present for an acute visit. Her primary care is Dr. Heron Sharper. She is complaining of dysuria and would like STD testing for exposure. Symptoms started about a week ago.  Also, had increase frequency. Denies abdominal pain, but reports back pain. Denies nausea, vomiting, or fever.   She reports she has been sexually active with one partner without protection since November. However, she has history of chlamydia a few years ago and feels some of the same symptoms as before.  Review of Systems  Genitourinary:  Positive for dysuria.   See HPI above      Objective:   BP 104/68   Pulse 90   Temp 97.8 F (36.6 C) (Oral)   Ht 5' 1.5 (1.562 m)   Wt 160 lb (72.6 kg)   LMP 10/08/2024 (Exact Date)   SpO2 99%   BMI 29.74 kg/m    Physical Exam Vitals reviewed.  Constitutional:      General: She is not in acute distress.    Appearance: Normal appearance. She is overweight. She is not ill-appearing, toxic-appearing or diaphoretic.  Eyes:     General:        Right eye: No discharge.        Left eye: No discharge.     Conjunctiva/sclera: Conjunctivae normal.  Cardiovascular:     Rate and Rhythm: Normal rate and regular rhythm.     Heart sounds: Normal heart sounds. No murmur heard.    No friction rub. No gallop.  Pulmonary:     Effort: Pulmonary effort is normal. No respiratory distress.     Breath sounds: Normal breath sounds.  Abdominal:     General: Bowel sounds are normal. There is no distension.     Palpations: Abdomen is soft. There is no mass.     Tenderness: There is abdominal tenderness (Left lower quad). There is no right CVA tenderness or left CVA tenderness.  Musculoskeletal:        General: Normal range of motion.  Skin:    General: Skin is warm and  dry.  Neurological:     General: No focal deficit present.     Mental Status: She is alert and oriented to person, place, and time. Mental status is at baseline.  Psychiatric:        Mood and Affect: Mood normal.        Behavior: Behavior normal.        Thought Content: Thought content normal.        Judgment: Judgment normal.     Latest Reference Range & Units 11/17/24 15:21  Bilirubin, UA  neg  Clarity, UA  cloudy  Color, UA  yellow  Glucose Negative  Negative  Ketones, UA  negative  Leukocytes,UA Negative  Negative  Nitrite, UA  negative  pH, UA 5.0 - 8.0  7.5  Protein,UA Negative  Negative  Specific Gravity, UA 1.010 - 1.025  1.010  Urobilinogen, UA 0.2 or 1.0 E.U./dL 0.2  RBC, UA  negative      Assessment & Plan:  Screening examination for STD (sexually transmitted disease) -     Cervicovaginal ancillary only -     HIV Antibody (routine testing w rflx) -  Hepatitis C antibody -     RPR W/RFLX TO RPR TITER, TREPONEMAL AB, SCREEN AND DIAGNOSIS -     Herpes Simplex Virus 1 and 2 (IgG), with Reflex to HSV-2 Inhibition  Dysuria -     POCT urinalysis dipstick  -Urinalysis is negative for any acute findings.  -Ordered a self vaginal swab and labs for sexually transmitted disease. Office will call with lab results and will be available via MyChart.  -Follow up or send a MyChart message if symptoms become worse before results are available.   Shanekia Latella, NP "

## 2024-11-18 ENCOUNTER — Ambulatory Visit: Payer: Self-pay | Admitting: Family Medicine

## 2024-11-18 DIAGNOSIS — B9689 Other specified bacterial agents as the cause of diseases classified elsewhere: Secondary | ICD-10-CM

## 2024-11-18 LAB — SYPHILIS: RPR W/REFLEX TO RPR TITER AND TREPONEMAL ANTIBODIES, TRADITIONAL SCREENING AND DIAGNOSIS ALGORITHM: RPR Ser Ql: NONREACTIVE

## 2024-11-18 LAB — HERPES SIMPLEX VIRUS 1 AND 2 (IGG),REFLEX HSV-2 INHIBITION
HSV 1 IGG,TYPE SPECIFIC AB: 5.29 {index} — ABNORMAL HIGH
HSV 2 IGG,TYPE SPECIFIC AB: <0.9 {index}

## 2024-11-18 LAB — HIV ANTIBODY (ROUTINE TESTING W REFLEX)
HIV 1&2 Ab, 4th Generation: NONREACTIVE
HIV FINAL INTERPRETATION: NEGATIVE

## 2024-11-18 LAB — HEPATITIS C ANTIBODY: Hepatitis C Ab: NONREACTIVE

## 2024-11-19 ENCOUNTER — Other Ambulatory Visit (HOSPITAL_COMMUNITY): Payer: Self-pay

## 2024-11-19 ENCOUNTER — Encounter: Payer: Self-pay | Admitting: Family Medicine

## 2024-11-19 ENCOUNTER — Telehealth: Payer: Self-pay

## 2024-11-19 LAB — CERVICOVAGINAL ANCILLARY ONLY
Bacterial Vaginitis (gardnerella): POSITIVE — AB
Candida Glabrata: NEGATIVE
Candida Vaginitis: NEGATIVE
Chlamydia: NEGATIVE
Comment: NEGATIVE
Comment: NEGATIVE
Comment: NEGATIVE
Comment: NEGATIVE
Comment: NEGATIVE
Comment: NORMAL
Neisseria Gonorrhea: NEGATIVE
Trichomonas: NEGATIVE

## 2024-11-19 NOTE — Telephone Encounter (Signed)
 Pharmacy Patient Advocate Encounter   Received notification from [employer] that prior authorization for Retin A 0.05% cream is required/requested.   Insurance verification completed.   The patient is insured through [facility] MEDICAID.   Per test claim: PA required; PA submitted to above mentioned insurance via Latent Key/confirmation #/EOC [id number] Status is pending

## 2024-11-20 MED ORDER — METRONIDAZOLE 500 MG PO TABS: 500.0000 mg | ORAL_TABLET | Freq: Two times a day (BID) | ORAL | 0 refills | Status: AC

## 2024-11-24 ENCOUNTER — Other Ambulatory Visit (HOSPITAL_COMMUNITY): Payer: Self-pay

## 2025-02-05 ENCOUNTER — Ambulatory Visit: Admitting: Family Medicine
